# Patient Record
Sex: Female | Born: 1990 | Race: White | Hispanic: No | Marital: Single | Smoking: Never smoker
Health system: Southern US, Community
[De-identification: ages and names within clinical notes are randomized; demographics above are authoritative.]

## PROBLEM LIST (undated history)

## (undated) DIAGNOSIS — F419 Anxiety disorder, unspecified: Secondary | ICD-10-CM

## (undated) DIAGNOSIS — Z9189 Other specified personal risk factors, not elsewhere classified: Secondary | ICD-10-CM

## (undated) DIAGNOSIS — Z803 Family history of malignant neoplasm of breast: Secondary | ICD-10-CM

## (undated) DIAGNOSIS — T7840XA Allergy, unspecified, initial encounter: Secondary | ICD-10-CM

## (undated) DIAGNOSIS — F32A Depression, unspecified: Secondary | ICD-10-CM

## (undated) DIAGNOSIS — Z8041 Family history of malignant neoplasm of ovary: Secondary | ICD-10-CM

## (undated) DIAGNOSIS — G709 Myoneural disorder, unspecified: Secondary | ICD-10-CM

## (undated) DIAGNOSIS — D649 Anemia, unspecified: Secondary | ICD-10-CM

## (undated) DIAGNOSIS — N6009 Solitary cyst of unspecified breast: Secondary | ICD-10-CM

## (undated) HISTORY — DX: Other specified personal risk factors, not elsewhere classified: Z91.89

## (undated) HISTORY — DX: Anemia, unspecified: D64.9

## (undated) HISTORY — DX: Family history of malignant neoplasm of ovary: Z80.41

## (undated) HISTORY — DX: Myoneural disorder, unspecified: G70.9

## (undated) HISTORY — DX: Solitary cyst of unspecified breast: N60.09

## (undated) HISTORY — PX: MANDIBLE SURGERY: SHX707

## (undated) HISTORY — DX: Family history of malignant neoplasm of breast: Z80.3

## (undated) HISTORY — DX: Allergy, unspecified, initial encounter: T78.40XA

---

## 2015-08-19 LAB — HM PAP SMEAR

## 2015-08-24 ENCOUNTER — Other Ambulatory Visit: Payer: Self-pay | Admitting: Medical

## 2015-08-24 ENCOUNTER — Ambulatory Visit
Admission: RE | Admit: 2015-08-24 | Discharge: 2015-08-24 | Disposition: A | Discharge status: Home / Self Care | Payer: BLUE CROSS/BLUE SHIELD | Source: Ambulatory Visit | Attending: Medical | Admitting: Medical

## 2015-08-24 ENCOUNTER — Encounter
Admission: RE | Admit: 2015-08-24 | Discharge: 2015-08-24 | Disposition: A | Discharge status: Home / Self Care | Payer: BLUE CROSS/BLUE SHIELD | Source: Ambulatory Visit | Attending: Medical | Admitting: Medical

## 2015-08-24 DIAGNOSIS — R52 Pain, unspecified: Secondary | ICD-10-CM

## 2015-08-24 DIAGNOSIS — R071 Chest pain on breathing: Secondary | ICD-10-CM | POA: Insufficient documentation

## 2016-05-15 DIAGNOSIS — R1031 Right lower quadrant pain: Secondary | ICD-10-CM | POA: Diagnosis not present

## 2016-11-09 ENCOUNTER — Ambulatory Visit (INDEPENDENT_AMBULATORY_CARE_PROVIDER_SITE_OTHER): Payer: Self-pay | Admitting: Obstetrics and Gynecology

## 2016-11-09 ENCOUNTER — Encounter: Payer: Self-pay | Admitting: Obstetrics and Gynecology

## 2016-11-09 VITALS — BP 120/80 | Ht 68.0 in | Wt 202.0 lb

## 2016-11-09 DIAGNOSIS — R1031 Right lower quadrant pain: Secondary | ICD-10-CM | POA: Diagnosis not present

## 2016-11-09 DIAGNOSIS — Z30011 Encounter for initial prescription of contraceptive pills: Secondary | ICD-10-CM

## 2016-11-09 MED ORDER — NORETHINDRONE-ETH ESTRADIOL 1-35 MG-MCG PO TABS: 1.0000 | ORAL_TABLET | Freq: Every day | ORAL | 0 refills | Status: DC

## 2016-11-09 NOTE — Progress Notes (Signed)
Chief Complaint  Patient presents with  . lower abd pain    HPI:      [name] is a 26 y.o. No obstetric history on file. who LMP was Patient's last menstrual period was 10/16/2016., presents today for episode of severe, sharp RLQ pain again 2 days ago. Sx tolerable with aleve, but still 7/10 on pain scale initially. Sx lasted through the night. Sx improving now though but still tender on RLQ. Pt denies any vag d/c, irritation, odor, fevers. Hasn't been sex active recently. Neg gon/chlam 2/17, but pt wasn't  sex active when STD testing done, and hasn't been since then.  She has a Hx of RLQ pain 11/17. U/S showed FF in CDS, questioned ruptured ovar cyst given sx and hx. She had ovar cyst about 3 1/2 yrs ago too. Pt on OCPs in the past but stopped them since not sex active.    Given recurrence of sx, pt interested in restarting OCPs. Did well on them in the past.  She has annual sched for 11/30/16.   There are no active problems to display for this patient.   Family History  Problem Relation Age of Onset  . Breast cancer Maternal Aunt 53  . Breast cancer Maternal Grandmother 40  . Breast cancer Maternal Grandfather 80  . Ovarian cancer Paternal Grandmother 87  . Breast cancer Cousin     maternal 2nd cousin    Social History   Social History  . Marital status: Single    Spouse name: N/A  . Number of children: N/A  . Years of education: N/A   Occupational History  . Not on file.   Social History Main Topics  . Smoking status: Not on file  . Smokeless tobacco: Not on file  . Alcohol use Not on file  . Drug use: Unknown  . Sexual activity: Not on file   Other Topics Concern  . Not on file   Social History Narrative  . No narrative on file     Current Outpatient Prescriptions:  .  cholecalciferol (VITAMIN D) 1000 units tablet, Take 1,000 Units by mouth daily., Disp: , Rfl:  .  norethindrone-ethinyl estradiol 1/35 (ORTHO-NOVUM, NORTREL,CYCLAFEM) tablet,  Take 1 tablet by mouth daily., Disp: 1 Package, Rfl: 0  Review of Systems  Constitutional: Negative for fever.  Gastrointestinal: Negative for blood in stool, constipation, diarrhea, nausea and vomiting.  Genitourinary: Positive for pelvic pain. Negative for dyspareunia, dysuria, flank pain, frequency, hematuria, urgency, vaginal bleeding, vaginal discharge and vaginal pain.  Musculoskeletal: Negative for back pain.  Skin: Negative for rash.     OBJECTIVE:   Vitals:  BP 120/80   Ht 5\' 8"  (1.727 m)   Wt 202 lb (91.6 kg)   LMP 10/16/2016   BMI 30.71 kg/m   Physical Exam  Constitutional: She is oriented to person, place, and time and well-developed, well-nourished, and in no distress. Vital signs are normal.  Genitourinary: Uterus normal, cervix normal, left adnexa normal and vulva normal. Uterus is not enlarged. Cervix exhibits no motion tenderness and no tenderness. Right adnexum displays tenderness. Right adnexum displays no mass. Left adnexum displays no mass and no tenderness. Vulva exhibits no erythema, no exudate, no lesion, no rash and no tenderness. Vagina exhibits no lesion.  Neurological: She is oriented to person, place, and time.  Vitals reviewed.   Assessment/Plan: RLQ abdominal pain - Sx improving. Hx of prob ruptured ovar cysts. Discussed OCPs/depo to prevent. Pt wants to restart OCPs. F/u  at annual in a few wks. Will check u/s if sx persist  Encounter for initial prescription of contraceptive pills - OCP Rx eRxd.  - Plan: norethindrone-ethinyl estradiol 1/35 (ORTHO-NOVUM, NORTREL,CYCLAFEM) tablet    Return if symptoms worsen or fail to improve.  Arnold Depinto B. Jakiya Bookbinder, PA-C 11/09/2016 11:51 AM

## 2016-11-22 ENCOUNTER — Telehealth: Payer: Self-pay | Admitting: Obstetrics and Gynecology

## 2016-11-22 NOTE — Telephone Encounter (Signed)
Pt is calling about her bill stating she is receiving an uninsured discount. Pt reports having insurance. Would you look into this and Notify pt. Thank you!

## 2016-11-30 ENCOUNTER — Encounter: Payer: Self-pay | Admitting: Advanced Practice Midwife

## 2016-11-30 ENCOUNTER — Ambulatory Visit (INDEPENDENT_AMBULATORY_CARE_PROVIDER_SITE_OTHER): Payer: BLUE CROSS/BLUE SHIELD | Admitting: Advanced Practice Midwife

## 2016-11-30 VITALS — BP 118/70 | HR 88 | Ht 68.0 in | Wt 206.0 lb

## 2016-11-30 DIAGNOSIS — Z01419 Encounter for gynecological examination (general) (routine) without abnormal findings: Secondary | ICD-10-CM | POA: Diagnosis not present

## 2016-11-30 DIAGNOSIS — Z30011 Encounter for initial prescription of contraceptive pills: Secondary | ICD-10-CM | POA: Diagnosis not present

## 2016-11-30 DIAGNOSIS — Z124 Encounter for screening for malignant neoplasm of cervix: Secondary | ICD-10-CM | POA: Diagnosis not present

## 2016-11-30 MED ORDER — NORETHINDRONE-ETH ESTRADIOL 1-35 MG-MCG PO TABS: 1.0000 | ORAL_TABLET | Freq: Every day | ORAL | 10 refills | Status: DC

## 2016-11-30 NOTE — Progress Notes (Signed)
Patient ID: Kelly Montoya, female   DOB: 1990-10-19, 26 y.o.   MRN: [id number]     Gynecology Annual Exam  PCP: Talmage Nap, PA-C  Chief Complaint:  Chief Complaint  Patient presents with  . Gynecologic Exam    History of Present Illness: Patient is a 26 y.o. G0P0000 presents for annual exam. The patient has no complaints today. She admits to increasing healthy lifestyle diet and increasing exercise, but has not yet noticed that she has lost any weight. She denies s/s of hypothyroid.  LMP: Patient's last menstrual period was 11/25/2016 (exact date). Average Interval: regular, 28 days Duration of flow: 7 days Heavy Menses: yes Clots: no Intermenstrual Bleeding: no Postcoital Bleeding: no Dysmenorrhea: no  The patient is not currently sexually active. She currently uses OCPs mainly for regulation of menses and history of ovarian cysts. She denies dyspareunia.  The patient does perform self breast exams.  There is notable family history of breast or ovarian cancer in her family. She has previously had my risk testing and plans to repeat at 5 years per plan with Ardeth Perfect, PA.  The patient wears seatbelts: yes.  The patient has regular exercise: yes.    The patient denies current symptoms of depression.    Review of Systems: Review of Systems  Constitutional: Negative.   HENT: Negative.   Eyes: Negative.   Respiratory: Negative.   Cardiovascular: Negative.   Gastrointestinal: Negative.   Genitourinary: Negative.   Musculoskeletal: Negative.   Skin: Negative.   Neurological: Negative.   Endo/Heme/Allergies: Negative.   Psychiatric/Behavioral: Negative.     Past Medical History:  Past Medical History:  Diagnosis Date  . Anemia   . Breast cyst   . Family history of breast cancer    My Risk neg 2/17, NBN VUS  . Family history of ovarian cancer   . Increased risk of breast cancer    IBIS Lifetime risk 25.4%; MyRisk neg    Past Surgical History:    History reviewed. No pertinent surgical history.  Gynecologic History:  Patient's last menstrual period was 11/25/2016 (exact date). Contraception: OCPs Last Pap: Results were: no abnormalities   Obstetric History: G0P0000  Family History:  Family History  Problem Relation Age of Onset  . Breast cancer Maternal Aunt 75  . Breast cancer Maternal Grandmother 33  . Breast cancer Maternal Grandfather 80  . Ovarian cancer Paternal Grandmother 27  . Breast cancer Cousin        maternal 2nd cousin    Social History:  Social History   Social History  . Marital status: Single    Spouse name: N/A  . Number of children: N/A  . Years of education: N/A   Occupational History  . Not on file.   Social History Main Topics  . Smoking status: Never Smoker  . Smokeless tobacco: Never Used  . Alcohol use Yes  . Drug use: No  . Sexual activity: Not Currently    Birth control/ protection: Pill   Other Topics Concern  . Not on file   Social History Narrative  . No narrative on file    Allergies:  No Known Allergies  Medications: Prior to Admission medications   Medication Sig Start Date End Date Taking? Authorizing Provider  cholecalciferol (VITAMIN D) 1000 units tablet Take 1,000 Units by mouth daily.    [provider]  norethindrone-ethinyl estradiol 1/35 (ORTHO-NOVUM, NORTREL,CYCLAFEM) tablet Take 1 tablet by mouth daily. 11/30/16   Rod Can, CNM  Physical Exam Vitals: Blood pressure 118/70, pulse 88, height 5\' 8"  (1.727 m), weight 206 lb (93.4 kg), last menstrual period 11/25/2016.  General: NAD HEENT: normocephalic, anicteric Thyroid: no enlargement, no palpable nodules Pulmonary: No increased work of breathing, CTAB Cardiovascular: RRR, distal pulses 2+ Breast: Breast symmetrical, no tenderness, no palpable nodules or masses, no skin or nipple retraction present, no nipple discharge.  No axillary or supraclavicular lymphadenopathy. Abdomen: NABS,  soft, non-tender, non-distended.  Umbilicus without lesions.  No hepatomegaly, splenomegaly or masses palpable. No evidence of hernia  Genitourinary:  External: Normal external female genitalia.  Normal urethral meatus, normal  Bartholin's and Skene's glands.    Vagina: Normal vaginal mucosa, no evidence of prolapse.    Cervix: Grossly normal in appearance, no bleeding, no CMT  Uterus: Non-enlarged, mobile, normal contour.    Adnexa: ovaries non-enlarged, no adnexal masses  Rectal: deferred  Lymphatic: no evidence of inguinal lymphadenopathy Extremities: no edema, erythema, or tenderness Neurologic: Grossly intact Psychiatric: mood appropriate, affect full     Assessment: 26 y.o. G0P0000 Well Woman exam with PAP  Plan: Problem List Items Addressed This Visit    None    Visit Diagnoses    Well woman exam with routine gynecological exam    -  Primary   Encounter for initial prescription of contraceptive pills       OCP Rx eRxd.    Relevant Medications   norethindrone-ethinyl estradiol 1/35 (ORTHO-NOVUM, NORTREL,CYCLAFEM) tablet   Cervical cancer screening       Relevant Orders   Pap IG w/ reflex to HPV when ASC-U      1) 4) Gardasil Series discussed and if applicable offered to patient - Patient has previously completed 1 of the 3 shot series about 6 years ago. She did not follow through with the remaining vaccines. She will be 26 at the end of this month and will not be eligible for insurance coverage to complete the series. She opts out of repeating the vaccine.  2) STI screening was offered and declined   3) ASCCP guidelines and rational discussed.  Patient opts for yearly screening interval  4) Contraception - Education given regarding options for contraception, including LARCs and Depo Provera. Pt chooses to continue on pills at this time.  5) Continue healthy lifestyle diet and exercise   6) Follow up 1 year for routine annual exam   Rod Can, CNM

## 2016-12-03 LAB — PAP IG W/ RFLX HPV ASCU: PAP SMEAR COMMENT: 0

## 2016-12-07 ENCOUNTER — Encounter: Payer: Self-pay | Admitting: Medical

## 2016-12-07 ENCOUNTER — Ambulatory Visit: Payer: BLUE CROSS/BLUE SHIELD | Admitting: Medical

## 2016-12-07 VITALS — BP 104/74 | HR 88 | Temp 99.4°F | Resp 16

## 2016-12-07 DIAGNOSIS — R5383 Other fatigue: Secondary | ICD-10-CM

## 2016-12-07 DIAGNOSIS — Z0000 Encounter for general adult medical examination without abnormal findings: Secondary | ICD-10-CM

## 2016-12-07 NOTE — Progress Notes (Signed)
Subjective:    Patient ID: Kelly Montoya, female    DOB: Dec 12, 1990, 26 y.o.   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 She has no complaints today.   Review of Systems  Constitutional: Positive for fatigue.  HENT: Negative.   Eyes: Negative.   Respiratory: Negative.   Cardiovascular: Negative.   Gastrointestinal: Negative.   Endocrine: Positive for heat intolerance.  Genitourinary: Negative.   Musculoskeletal: Negative.   Skin: Negative.   Allergic/Immunologic: Positive for environmental allergies.  Neurological: Positive for headaches.  Hematological: Bruises/bleeds easily.  Psychiatric/Behavioral: Negative.    Headaches with her mensis cycle. Takes Aleve which helps. History of ovarian cyst in April and November of  2017. This is why she was placed on OCP,  Which she started 2 weeks ago.She has had some spotting on the OCP.    Objective:   Physical Exam  Constitutional: She is oriented to person, place, and time. She appears well-developed and well-nourished.  HENT:  Head: Normocephalic and atraumatic.  Right Ear: Hearing, tympanic membrane, external ear and ear canal normal.  Left Ear: Hearing, tympanic membrane, external ear and ear canal normal.  Nose: Nose normal.  Mouth/Throat: Uvula is midline, oropharynx is clear and moist and mucous membranes are normal.  Eyes: Conjunctivae, EOM and lids are normal. Pupils are equal, round, and reactive to light.  Neck: Trachea normal, normal range of motion and full passive range of motion without pain. Neck supple. No thyromegaly present.  Cardiovascular: Normal rate, regular rhythm, normal heart sounds, intact distal pulses and normal pulses.  Exam reveals no gallop and no friction rub.   No murmur heard. Pulmonary/Chest: Breath sounds normal.  Abdominal: Soft. Normal  appearance and bowel sounds are normal.  Musculoskeletal: Normal range of motion.  Lymphadenopathy:    She has no cervical adenopathy.       Right: No supraclavicular adenopathy present.       Left: No supraclavicular adenopathy present.  Neurological: She is alert and oriented to person, place, and time. She has normal strength and normal reflexes. No cranial nerve deficit or sensory deficit. She displays a negative Romberg sign. GCS eye subscore is 4. GCS verbal subscore is 5. GCS motor subscore is 6.  Reflex Scores:      Brachioradialis reflexes are 2+ on the right side and 2+ on the left side.      Patellar reflexes are 2+ on the right side and 2+ on the left side.      Achilles reflexes are 2+ on the right side and 2+ on the left side. Skin: Skin is warm, dry and intact. Ecchymosis noted. No cyanosis. Nails show no clubbing.     Psychiatric: She has a normal mood and affect. Her speech is normal and behavior is normal. Judgment and thought content normal. Cognition and memory are normal.  Nursing note and vitals reviewed.  Upper right thigh old brown bruise. Patient unsure how she got bruised.Measures about 4 cm x 3 cm. Non-tender to palpation.       Assessment & Plan:  Primary care visit. Ordered CBC w/diff, Met C, TSH and thyroid panel, Vitamin D level and B12 and folate. Will call patient with results. RN Bubba Camp called patient with lab results. All labs within normal limits except low Vitamin D. Low Vitamin D3 4000IU/day recheck in 3 months. ( called  patient on 12/19/16)

## 2016-12-08 LAB — COMPREHENSIVE METABOLIC PANEL
A/G RATIO: 1.8 (ref 1.2–2.2)
ALT: 21 IU/L (ref 0–32)
AST: 23 IU/L (ref 0–40)
Albumin: 4.5 g/dL (ref 3.5–5.5)
Alkaline Phosphatase: 53 IU/L (ref 39–117)
BILIRUBIN TOTAL: 0.4 mg/dL (ref 0.0–1.2)
BUN/Creatinine Ratio: 12 (ref 9–23)
BUN: 10 mg/dL (ref 6–20)
CHLORIDE: 104 mmol/L (ref 96–106)
CO2: 23 mmol/L (ref 18–29)
Calcium: 9.8 mg/dL (ref 8.7–10.2)
Creatinine, Ser: 0.82 mg/dL (ref 0.57–1.00)
GFR calc non Af Amer: 98 mL/min/{1.73_m2} (ref >59)
GFR, EST AFRICAN AMERICAN: 113 mL/min/{1.73_m2} (ref >59)
Globulin, Total: 2.5 g/dL (ref 1.5–4.5)
Glucose: 85 mg/dL (ref 65–99)
POTASSIUM: 4.4 mmol/L (ref 3.5–5.2)
Sodium: 142 mmol/L (ref 134–144)
TOTAL PROTEIN: 7 g/dL (ref 6.0–8.5)

## 2016-12-08 LAB — CBC WITH DIFFERENTIAL/PLATELET
BASOS ABS: 0 10*3/uL (ref 0.0–0.2)
Basos: 0 %
EOS (ABSOLUTE): 0.1 10*3/uL (ref 0.0–0.4)
Eos: 2 %
Hematocrit: 39.1 % (ref 34.0–46.6)
Hemoglobin: 12.9 g/dL (ref 11.1–15.9)
IMMATURE GRANS (ABS): 0 10*3/uL (ref 0.0–0.1)
Immature Granulocytes: 0 %
LYMPHS: 38 %
Lymphocytes Absolute: 2.1 10*3/uL (ref 0.7–3.1)
MCH: 28 pg (ref 26.6–33.0)
MCHC: 33 g/dL (ref 31.5–35.7)
MCV: 85 fL (ref 79–97)
MONOS ABS: 0.4 10*3/uL (ref 0.1–0.9)
Monocytes: 7 %
NEUTROS ABS: 2.9 10*3/uL (ref 1.4–7.0)
Neutrophils: 53 %
PLATELETS: 254 10*3/uL (ref 150–379)
RBC: 4.61 x10E6/uL (ref 3.77–5.28)
RDW: 14.4 % (ref 12.3–15.4)
WBC: 5.5 10*3/uL (ref 3.4–10.8)

## 2016-12-08 LAB — THYROID PANEL WITH TSH
FREE THYROXINE INDEX: 1.4 (ref 1.2–4.9)
T3 Uptake Ratio: 18 % — ABNORMAL LOW (ref 24–39)
T4, Total: 8 ug/dL (ref 4.5–12.0)
TSH: 1.82 u[IU]/mL (ref 0.450–4.500)

## 2016-12-08 LAB — VITAMIN D 25 HYDROXY (VIT D DEFICIENCY, FRACTURES): VIT D 25 HYDROXY: 36.8 ng/mL (ref 30.0–100.0)

## 2016-12-14 LAB — SPECIMEN STATUS REPORT

## 2016-12-14 LAB — B12 AND FOLATE PANEL
Folate: 16.5 ng/mL (ref >3.0)
VITAMIN B 12: 287 pg/mL (ref 232–1245)

## 2017-04-12 DIAGNOSIS — Z23 Encounter for immunization: Secondary | ICD-10-CM | POA: Diagnosis not present

## 2017-11-01 ENCOUNTER — Other Ambulatory Visit: Payer: Self-pay | Admitting: Advanced Practice Midwife

## 2017-11-01 DIAGNOSIS — Z30011 Encounter for initial prescription of contraceptive pills: Secondary | ICD-10-CM

## 2017-11-29 ENCOUNTER — Other Ambulatory Visit: Payer: Self-pay | Admitting: Advanced Practice Midwife

## 2017-11-29 DIAGNOSIS — Z30011 Encounter for initial prescription of contraceptive pills: Secondary | ICD-10-CM

## 2018-02-20 DIAGNOSIS — Z113 Encounter for screening for infections with a predominantly sexual mode of transmission: Secondary | ICD-10-CM | POA: Diagnosis not present

## 2018-02-20 DIAGNOSIS — Z3040 Encounter for surveillance of contraceptives, unspecified: Secondary | ICD-10-CM | POA: Diagnosis not present

## 2018-02-20 DIAGNOSIS — Z23 Encounter for immunization: Secondary | ICD-10-CM | POA: Diagnosis not present

## 2018-02-20 DIAGNOSIS — Z6831 Body mass index (BMI) 31.0-31.9, adult: Secondary | ICD-10-CM | POA: Diagnosis not present

## 2018-02-20 DIAGNOSIS — Z124 Encounter for screening for malignant neoplasm of cervix: Secondary | ICD-10-CM | POA: Diagnosis not present

## 2018-02-20 DIAGNOSIS — Z01411 Encounter for gynecological examination (general) (routine) with abnormal findings: Secondary | ICD-10-CM | POA: Diagnosis not present

## 2018-04-22 DIAGNOSIS — Z23 Encounter for immunization: Secondary | ICD-10-CM | POA: Diagnosis not present

## 2018-05-01 DIAGNOSIS — J029 Acute pharyngitis, unspecified: Secondary | ICD-10-CM | POA: Diagnosis not present

## 2018-08-06 DIAGNOSIS — Z23 Encounter for immunization: Secondary | ICD-10-CM | POA: Diagnosis not present

## 2018-08-06 DIAGNOSIS — Z3040 Encounter for surveillance of contraceptives, unspecified: Secondary | ICD-10-CM | POA: Diagnosis not present

## 2018-10-22 DIAGNOSIS — H5213 Myopia, bilateral: Secondary | ICD-10-CM | POA: Diagnosis not present

## 2019-07-10 ENCOUNTER — Ambulatory Visit: Payer: BC Managed Care – PPO | Attending: Internal Medicine

## 2019-07-10 DIAGNOSIS — Z20822 Contact with and (suspected) exposure to covid-19: Secondary | ICD-10-CM

## 2019-07-12 LAB — NOVEL CORONAVIRUS, NAA: SARS-CoV-2, NAA: NOT DETECTED

## 2019-10-04 ENCOUNTER — Ambulatory Visit: Payer: BC Managed Care – PPO

## 2019-10-13 DIAGNOSIS — M5126 Other intervertebral disc displacement, lumbar region: Secondary | ICD-10-CM | POA: Insufficient documentation

## 2019-11-07 ENCOUNTER — Other Ambulatory Visit: Payer: Self-pay

## 2019-11-07 ENCOUNTER — Ambulatory Visit: Payer: BC Managed Care – PPO | Attending: Family Medicine | Admitting: Rehabilitative and Restorative Service Providers"

## 2019-11-07 ENCOUNTER — Encounter: Payer: Self-pay | Admitting: Rehabilitative and Restorative Service Providers"

## 2019-11-07 DIAGNOSIS — M545 Low back pain, unspecified: Secondary | ICD-10-CM

## 2019-11-07 DIAGNOSIS — M25651 Stiffness of right hip, not elsewhere classified: Secondary | ICD-10-CM

## 2019-11-07 DIAGNOSIS — M25652 Stiffness of left hip, not elsewhere classified: Secondary | ICD-10-CM

## 2019-11-07 DIAGNOSIS — R2689 Other abnormalities of gait and mobility: Secondary | ICD-10-CM | POA: Diagnosis present

## 2019-11-07 DIAGNOSIS — M5416 Radiculopathy, lumbar region: Secondary | ICD-10-CM | POA: Insufficient documentation

## 2019-11-07 DIAGNOSIS — M25552 Pain in left hip: Secondary | ICD-10-CM | POA: Diagnosis present

## 2019-11-07 NOTE — Therapy (Signed)
[facility] [address] [place], [place], [address] Phone: [phone]   Fax:  [phone]  Physical Therapy Evaluation  Patient Details  Name: Kelly Montoya MRN: [id number] Date of Birth: 1990-10-01 Referring Provider (PT): Rachell Cipro MD   Encounter Date: 11/07/2019  PT End of Session - 11/07/19 1500    Visit Number  1    Number of Visits  17    Date for PT Re-Evaluation  01/06/20    Authorization Type  BCBS    PT Start Time  0845    PT Stop Time  0927    PT Time Calculation (min)  42 min    Activity Tolerance  Patient tolerated treatment well    Behavior During Therapy  [facility] for tasks assessed/performed       Past Medical History:  Diagnosis Date  . Allergy   . Anemia   . Breast cyst   . Family history of breast cancer    My Risk neg 2/17, NBN VUS  . Family history of ovarian cancer   . Increased risk of breast cancer    IBIS Lifetime risk 25.4%; MyRisk neg  . Neuromuscular disorder [facility])     Past Surgical History:  Procedure Laterality Date  . MANDIBLE SURGERY Bilateral 01/012011   nerve damage with numbness on the chin on the right side.    There were no vitals filed for this visit.   Subjective Assessment - 11/07/19 0848    Subjective  Patient reports that 4 weeks ago she had pain shooting down her LLE. She had pain so bad that she was feeling light headed. Went to urgent care and they sent her to the ED. She was hospitalized for 4 days. She was on steroids and gabapentin. She is now on a muscle relaxer and OTC anti-inflammatory. While she is seated, she is okay. I can't stand for a long period of time - showering is challenging to me. I still have some pain in my L ankle (I rarely feel the pain in my L foot/toes).    Pertinent History  L4/5 and L5/S1 herniated disc    Limitations  House hold activities;Lifting;Standing;Walking    How long can you sit comfortably?  I try to sit reclined to  make myself comfortable - when sitting in this position, I can be here for a long while    How long can you stand comfortably?  5 minutes maximum    How long can you walk comfortably?  I haven't tried it    Patient Stated Goals  reduce my pain and allow return to normal life    Currently in Pain?  Yes    Pain Score  3     Pain Location  Back    Pain Orientation  Left;Mid;Lower    Pain Descriptors / Indicators  Dull    Pain Type  Acute pain   ~ 1 month   Pain Radiating Towards  L ankle    Pain Onset  1 to 4 weeks ago    Pain Frequency  Constant    Aggravating Factors   weight bearing, walking, standing         [facility] PT Assessment - 11/07/19 0853      Assessment   Medical Diagnosis  Lumbar Spine Pain     Referring Provider (PT)  Rachell Cipro MD    Onset Date/Surgical Date  10/11/19   hospitalized due to pain in lumbar spine and LLE  Prior Therapy  none      Precautions   Precautions  None      Balance Screen   Has the patient fallen in the past 6 months  No    Has the patient had a decrease in activity level because of a fear of falling?   No    Is the patient reluctant to leave their home because of a fear of falling?   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; hoping to return to work in person - sitting on couch to perform computer work as sitting at [employer] table/chair was very painful    Leisure  hiking, walking dog, swimming  (Garment/textile technologist)       Cognition   Overall Cognitive Status  Within Functional Limits for tasks assessed      Sensation   Light Touch  Appears Intact   per assessment of BLEs   Hot/Cold  Appears Intact   can differentiate hot/cold water in shower     ROM / Strength   AROM / PROM / Strength  AROM;Strength      AROM   Overall AROM   Deficits    Overall AROM Comments  L knee extension is full range, but painful when attempting to perform LAQ in sitting - able to achieve full range in supine     AROM Assessment Site  Hip;Lumbar    Right/Left Hip  Left    Left Hip External Rotation   32   stiff and painful at end range   Lumbar Flexion  41   painful at end range - aggravating to LLE rad sx   Lumbar Extension  trace   more thoracic/lumbar junction extension; min true l/s ext     Strength   Overall Strength  Deficits    Strength Assessment Site  Hip;Knee;Ankle    Right/Left Hip  Right;Left    Right Hip Flexion  5/5    Right Hip Extension  4+/5    Right Hip External Rotation   5/5    Right Hip Internal Rotation  5/5    Right Hip ABduction  5/5    Right Hip ADduction  5/5    Left Hip Flexion  4-/5    Left Hip Extension  4-/5    Left Hip External Rotation  3-/5   due to ROM restriction   Left Hip Internal Rotation  4/5    Left Hip ABduction  4/5    Left Hip ADduction  4+/5    Right/Left Knee  Right;Left    Right Knee Flexion  5/5    Right Knee Extension  5/5    Left Knee Flexion  4/5    Left Knee Extension  3/5    Right/Left Ankle  Right;Left  Right Ankle Dorsiflexion  5/5    Left Ankle Dorsiflexion  5/5      Ambulation/Gait   Ambulation/Gait  Yes    Ambulation/Gait Assistance  6: Modified independent (Device/Increase time)    Ambulation Distance (Feet)  150 Feet    Assistive device  None    Gait Pattern  Decreased step length - right;Decreased stance time - left;Decreased weight shift to left    Ambulation Surface  Level;Indoor    Gait velocity  2.72 ft/sec                 Objective measurements completed on examination: See above findings.              PT Education - 11/07/19 1500    Education Details  extensive education on disc herniation and rationale behidn assessing for a lumbar spine directional preference, POC, role of PT, findings from today's assessment    Person(s) Educated  Patient    Methods  Explanation    Comprehension  Verbalized understanding;Need further instruction       PT Short Term Goals - 11/07/19 1517      PT SHORT TERM GOAL #1   Title  Patient will be independent with HEP to foster improved strength and reduced pain. (ALL STGs DUE 12/07/19)    Time  4    Period  Weeks    Status  New    Target Date  12/07/19      PT SHORT TERM GOAL #2   Title  Patient will have >/= 10 degrees of lumbar extension in order to demonstrate a decreased reliance on compensatory movement strategies during ADLs.    Time  4    Period  Weeks    Status  New      PT SHORT TERM GOAL #3   Title  Patient will demonstrate >/= 40 degrees of bilateral hip rotation to decrease reliance on compensatory movements during ADLs to reduce injury risk.    Time  4    Period  Weeks    Status  New      PT SHORT TERM GOAL #4   Title  Patient will report she is able to tolerate standing activities (such as meal prep) for >/= 15 minutes with use of exercise/positioning as needed to indicate improvement in ability to perform ADLs.    Time  4    Period  Weeks    Status  New        PT Long Term Goals - 11/07/19 1508      PT LONG TERM GOAL #1   Title  Patient will be independent with HEP to foster improved strength and reduced pain. (ALL LTGs DUE 01/06/20)    Time  8    Period  Weeks    Status  New    Target Date  01/06/20      PT LONG TERM GOAL #2   Title  Patient will demonstrate >/= 45 degrees of bilateral hip rotation to decrease reliance on compensatory movements during ADLs to reduce injury risk.    Time  8    Period  Weeks     Status  New      PT LONG TERM GOAL #3   Title  Patient's bilateral hip flexors, extensors, and abductors will be 5/5 to demonstrate improvement in functional mobility.    Time  8    Period  Weeks    Status  New      PT LONG TERM  GOAL #4   Title  Patient will have >/= 20 degrees of lumbar extension in order to demonstrate a decreased reliance on compensatory movement strategies during ADLs.    Time  8    Period  Weeks    Status  New      PT LONG TERM GOAL #5   Title  Patient will report she is able to fully return to all work related duties with ability to verbalize changes in mechanics/ergonomics as needed to modify her work day/environment to decrease risk of further injury.    Time  8    Period  Weeks    Status  New             Plan - 11/07/19 1501    Clinical Impression Statement  Patient is a 29 year old female presenting to [facility] neuro for evaluation of her lumbar spine and left lower extremity radicular pain, which has been present for approximately 1 month with no specific mechanism of injury; however, was so severe she was hospitalized for 4 days in early April 2021. Her past medical history is significant for L4/5 and L5/S1 disc herniation. She presents today with marked asymmetry in LE range of motion and strength with increased impairment noted on left side, poor lumbar spine range of motion, and poor postural awareness and use of mechanics. PT able to begin assessment for a lumbar spine directional preference, but limited due to time constraints. She will benefit from skilled PT in order to maximize functional mobility and reduce pain.    Personal Factors and Comorbidities  Comorbidity 1    Comorbidities  L4/L5 and L5/S1 disc herniation    Examination-Activity Limitations  Sit;Sleep;Lift;Squat;Locomotion Level;Stairs;Carry;Stand    Examination-Participation Restrictions  Interpersonal Relationship;Yard Work;Cleaning;Laundry;Community Activity;Shop;Driving;Meal Prep     Stability/Clinical Decision Making  Stable/Uncomplicated    Clinical Decision Making  Low    Rehab Potential  Good    PT Frequency  2x / week   with plans to taper to 1x/wk if indicated in future once pain is managed   PT Duration  8 weeks    PT Treatment/Interventions  ADLs/Self Care Home Management;Aquatic Therapy;Biofeedback;Traction;Moist Heat;Electrical Stimulation;Cryotherapy;Ultrasound;DME Instruction;Gait training;Therapeutic exercise;Therapeutic activities;Functional mobility training;Stair training;Balance training;Neuromuscular re-education;Cognitive remediation;Patient/family education;Manual techniques;Passive range of motion;Energy conservation;Spinal Manipulations;Joint Manipulations    PT Next Visit Plan  initial HEP for lumbar stabilization and hip external rotation stretching, assessment for a lumbar spine directional preference to reduce radicular symptoms    Consulted and Agree with Plan of Care  Patient       Patient will benefit from skilled therapeutic intervention in order to improve the following deficits and impairments:  Abnormal gait, Decreased balance, Decreased endurance, Decreased mobility, Difficulty walking, Decreased range of motion, Impaired perceived functional ability, Improper body mechanics, Decreased activity tolerance, Decreased strength, Impaired flexibility, Postural dysfunction, Pain  Visit Diagnosis: Radiculopathy, lumbar region  Pain in left hip  Stiffness of left hip, not elsewhere classified  Other abnormalities of gait and mobility  Stiffness of right hip, not elsewhere classified  Acute midline low back pain, unspecified whether sciatica present     Problem List There are no problems to display for this patient.   Juliann Pulse, PT, DPT  11/07/2019, 3:23 PM  [facility] [address] [place], [place], [address] Phone: [phone]   Fax:  [phone]  Name:  Kelly Montoya MRN: [id number] Date of Birth: 1990/10/11

## 2019-11-11 ENCOUNTER — Other Ambulatory Visit: Payer: Self-pay

## 2019-11-11 ENCOUNTER — Ambulatory Visit: Payer: BC Managed Care – PPO | Attending: Family Medicine | Admitting: Physical Therapy

## 2019-11-11 ENCOUNTER — Encounter: Payer: Self-pay | Admitting: Physical Therapy

## 2019-11-11 DIAGNOSIS — M545 Low back pain, unspecified: Secondary | ICD-10-CM

## 2019-11-11 DIAGNOSIS — M25652 Stiffness of left hip, not elsewhere classified: Secondary | ICD-10-CM | POA: Insufficient documentation

## 2019-11-11 DIAGNOSIS — R2689 Other abnormalities of gait and mobility: Secondary | ICD-10-CM | POA: Insufficient documentation

## 2019-11-11 DIAGNOSIS — M25651 Stiffness of right hip, not elsewhere classified: Secondary | ICD-10-CM | POA: Insufficient documentation

## 2019-11-11 DIAGNOSIS — M5416 Radiculopathy, lumbar region: Secondary | ICD-10-CM | POA: Diagnosis not present

## 2019-11-11 DIAGNOSIS — M25552 Pain in left hip: Secondary | ICD-10-CM | POA: Diagnosis present

## 2019-11-11 NOTE — Patient Instructions (Signed)
Access Code: [email or web address] URL: [email or web address] Date: 11/11/2019 Prepared by: Willow Ora  Exercises Supine Transversus Abdominis Bracing - Hands on Ground - 1 x daily - 5 x weekly - 1 sets - 10 reps - 5 hold Hooklying Sequential Leg March and Lower - 1 x daily - 5 x weekly - 1 sets - 10 reps Supine Bilateral Isometric Hip Flexion - 1 x daily - 5 x weekly - 1 sets - 10 reps - 5 hold Hooklying Single Knee to Chest Stretch - 1 x daily - 5 x weekly - 1 sets - 3 reps - 30 hold Supine Double Knee to Chest - 1 x daily - 5 x weekly - 1 sets - 3 reps - 30 hold Cat-Camel - 1 x daily - 5 x weekly - 1 sets - 10 reps Child's Pose Stretch - 1 x daily - 5 x weekly - 1 sets - 3 reps - 30 hold

## 2019-11-11 NOTE — Therapy (Signed)
[facility] [address] [place], [place], [address] Phone: [phone]   Fax:  [phone]  Physical Therapy Treatment  Patient Details  Name: Kelly Montoya MRN: [id number] Date of Birth: 1990/11/30 Referring Provider (PT): Rachell Cipro MD   Encounter Date: 11/11/2019  PT End of Session - 11/11/19 0720    Visit Number  2    Number of Visits  17    Date for PT Re-Evaluation  01/06/20    Authorization Type  BCBS    PT Start Time  0716    PT Stop Time  0757    PT Time Calculation (min)  41 min    Activity Tolerance  Patient tolerated treatment well    Behavior During Therapy  [facility] for tasks assessed/performed       Past Medical History:  Diagnosis Date  . Allergy   . Anemia   . Breast cyst   . Family history of breast cancer    My Risk neg 2/17, NBN VUS  . Family history of ovarian cancer   . Increased risk of breast cancer    IBIS Lifetime risk 25.4%; MyRisk neg  . Neuromuscular disorder [facility])     Past Surgical History:  Procedure Laterality Date  . MANDIBLE SURGERY Bilateral 01/012011   nerve damage with numbness on the chin on the right side.    There were no vitals filed for this visit.  Subjective Assessment - 11/11/19 0717    Subjective  No new complaints. No falls. Continues with radicular pain into left LE.    Pertinent History  L4/5 and L5/S1 herniated disc    Limitations  House hold activities;Lifting;Standing;Walking    How long can you sit comfortably?  I try to sit reclined to make myself comfortable - when sitting in this position, I can be here for a long while    How long can you stand comfortably?  5 minutes maximum    How long can you walk comfortably?  I haven't tried it    Patient Stated Goals  reduce my pain and allow return to normal life    Currently in Pain?  Yes    Pain Score  4     Pain Location  Back    Pain Orientation  Left;Mid;Lower    Pain Descriptors /  Indicators  Dull;Shooting    Pain Type  Acute pain    Pain Radiating Towards  down left LE to the ankle    Pain Onset  More than a month ago    Pain Frequency  Constant    Aggravating Factors   weight bearing, walking, standing    Pain Relieving Factors  Muscle relaxer, OTC pain meds          [facility] Adult PT Treatment/Exercise - 11/11/19 0721      Exercises   Exercises  Other Exercises    Other Exercises   issued ex's to HEP to address core strengthening and stretching. refer to [place] for full details.       Manual Therapy   Manual Therapy  Manual Traction    Manual Traction  manual sheet traction for 30 sec holds  5 reps. Pt with reports of decreased radicular pain with traction; left LE distraction in hooklying position after core strengthening due to return of radicular symptoms. held for 10 sec's for 5 reps with decreased symptoms reported.      Issued to HEP today:   Access Code: [email or web address] URL: [email or web address]  Date: 11/11/2019 Prepared by: Willow Ora  Exercises Supine Transversus Abdominis Bracing - Hands on Ground - 1 x daily - 5 x weekly - 1 sets - 10 reps - 5 hold Hooklying Sequential Leg March and Lower - 1 x daily - 5 x weekly - 1 sets - 10 reps Supine Bilateral Isometric Hip Flexion - 1 x daily - 5 x weekly - 1 sets - 10 reps - 5 hold Hooklying Single Knee to Chest Stretch - 1 x daily - 5 x weekly - 1 sets - 3 reps - 30 hold Supine Double Knee to Chest - 1 x daily - 5 x weekly - 1 sets - 3 reps - 30 hold Cat-Camel - 1 x daily - 5 x weekly - 1 sets - 10 reps Child's Pose Stretch - 1 x daily - 5 x weekly - 1 sets - 3 reps - 30 hold       PT Education - 11/11/19 0749    Education Details  HEP to address core strengthening and stretching; purpose of mechanical traction with transfer to ortho clinic to trial it.    Person(s) Educated  Patient    Methods  Explanation;Demonstration    Comprehension  Verbalized understanding;Returned  demonstration;Verbal cues required;Need further instruction       PT Short Term Goals - 11/07/19 1517      PT SHORT TERM GOAL #1   Title  Patient will be independent with HEP to foster improved strength and reduced pain. (ALL STGs DUE 12/07/19)    Time  4    Period  Weeks    Status  New    Target Date  12/07/19      PT SHORT TERM GOAL #2   Title  Patient will have >/= 10 degrees of lumbar extension in order to demonstrate a decreased reliance on compensatory movement strategies during ADLs.    Time  4    Period  Weeks    Status  New      PT SHORT TERM GOAL #3   Title  Patient will demonstrate >/= 40 degrees of bilateral hip rotation to decrease reliance on compensatory movements during ADLs to reduce injury risk.    Time  4    Period  Weeks    Status  New      PT SHORT TERM GOAL #4   Title  Patient will report she is able to tolerate standing activities (such as meal prep) for >/= 15 minutes with use of exercise/positioning as needed to indicate improvement in ability to perform ADLs.    Time  4    Period  Weeks    Status  New        PT Long Term Goals - 11/07/19 1508      PT LONG TERM GOAL #1   Title  Patient will be independent with HEP to foster improved strength and reduced pain. (ALL LTGs DUE 01/06/20)    Time  8    Period  Weeks    Status  New    Target Date  01/06/20      PT LONG TERM GOAL #2   Title  Patient will demonstrate >/= 45 degrees of bilateral hip rotation to decrease reliance on compensatory movements during ADLs to reduce injury risk.    Time  8    Period  Weeks    Status  New      PT LONG TERM GOAL #3   Title  Patient's bilateral hip flexors,  extensors, and abductors will be 5/5 to demonstrate improvement in functional mobility.    Time  8    Period  Weeks    Status  New      PT LONG TERM GOAL #4   Title  Patient will have >/= 20 degrees of lumbar extension in order to demonstrate a decreased reliance on compensatory movement strategies during  ADLs.    Time  8    Period  Weeks    Status  New      PT LONG TERM GOAL #5   Title  Patient will report she is able to fully return to all work related duties with ability to verbalize changes in mechanics/ergonomics as needed to modify her work day/environment to decrease risk of further injury.    Time  8    Period  Weeks    Status  New            Plan - 11/11/19 0720    Clinical Impression Statement  Today's skilled session focused on use of manual sheet traction for decreased radicular symptoms with positive results. Will plan to try one session at our ortho clinic to trial use of the mechanical traction machine (pt not able to get an appt until 2 weeks from now). Remainder of session focused on establishment of an HEP for core strengthening and flexibility with flexion focus (pt has flexion peference). The pt is progressing toward goals and should benefit from continued PT to progress toward unmet goals.    Personal Factors and Comorbidities  Comorbidity 1    Comorbidities  L4/L5 and L5/S1 disc herniation    Examination-Activity Limitations  Sit;Sleep;Lift;Squat;Locomotion Level;Stairs;Carry;Stand    Examination-Participation Restrictions  Interpersonal Relationship;Yard Work;Cleaning;Laundry;Community Activity;Shop;Driving;Meal Prep    Stability/Clinical Decision Making  Stable/Uncomplicated    Rehab Potential  Good    PT Frequency  2x / week   with plans to taper to 1x/wk if indicated in future once pain is managed   PT Duration  8 weeks    PT Treatment/Interventions  ADLs/Self Care Home Management;Aquatic Therapy;Biofeedback;Traction;Moist Heat;Electrical Stimulation;Cryotherapy;Ultrasound;DME Instruction;Gait training;Therapeutic exercise;Therapeutic activities;Functional mobility training;Stair training;Balance training;Neuromuscular re-education;Cognitive remediation;Patient/family education;Manual techniques;Passive range of motion;Energy conservation;Spinal  Manipulations;Joint Manipulations    PT Next Visit Plan  continue with manual traction for decreased radicular pain/symptoms (has ortho appt on 12/04/19 to trial mechanical traction, monitor if this will still be needed as it's far out), continue with core/LE strengthening/stretching    PT Home Exercise Plan  Access Code: [email or web address]    Consulted and Agree with Plan of Care  Patient       Patient will benefit from skilled therapeutic intervention in order to improve the following deficits and impairments:  Abnormal gait, Decreased balance, Decreased endurance, Decreased mobility, Difficulty walking, Decreased range of motion, Impaired perceived functional ability, Improper body mechanics, Decreased activity tolerance, Decreased strength, Impaired flexibility, Postural dysfunction, Pain  Visit Diagnosis: Radiculopathy, lumbar region  Pain in left hip  Stiffness of left hip, not elsewhere classified  Other abnormalities of gait and mobility  Stiffness of right hip, not elsewhere classified  Acute midline low back pain, unspecified whether sciatica present     Problem List There are no problems to display for this patient.   Willow Ora, PTA, [place] [address] [place], [place] [address] [phone] 11/11/19, 8:06 AM   Name: Kelly Montoya MRN: [id number] Date of Birth: January 20, 1991

## 2019-11-14 ENCOUNTER — Other Ambulatory Visit: Payer: Self-pay

## 2019-11-14 ENCOUNTER — Ambulatory Visit: Payer: BC Managed Care – PPO | Admitting: Rehabilitative and Restorative Service Providers"

## 2019-11-14 ENCOUNTER — Encounter: Payer: Self-pay | Admitting: Rehabilitative and Restorative Service Providers"

## 2019-11-14 DIAGNOSIS — M25652 Stiffness of left hip, not elsewhere classified: Secondary | ICD-10-CM

## 2019-11-14 DIAGNOSIS — M5416 Radiculopathy, lumbar region: Secondary | ICD-10-CM | POA: Diagnosis not present

## 2019-11-14 DIAGNOSIS — M545 Low back pain, unspecified: Secondary | ICD-10-CM

## 2019-11-14 DIAGNOSIS — M25651 Stiffness of right hip, not elsewhere classified: Secondary | ICD-10-CM

## 2019-11-14 DIAGNOSIS — M25552 Pain in left hip: Secondary | ICD-10-CM

## 2019-11-14 NOTE — Therapy (Signed)
[facility] [address] [place], [place], [address] Phone: [phone]   Fax:  [phone]  Physical Therapy Treatment  Patient Details  Name: Kelly Montoya MRN: [id number] Date of Birth: 11/15/1990 Referring Provider (PT): Rachell Cipro MD   Encounter Date: 11/14/2019  PT End of Session - 11/14/19 1338    Visit Number  3    Number of Visits  17    Date for PT Re-Evaluation  01/06/20    Authorization Type  BCBS    PT Start Time  1225    PT Stop Time  1314    PT Time Calculation (min)  49 min    Activity Tolerance  Patient tolerated treatment well    Behavior During Therapy  [facility] for tasks assessed/performed       Past Medical History:  Diagnosis Date  . Allergy   . Anemia   . Breast cyst   . Family history of breast cancer    My Risk neg 2/17, NBN VUS  . Family history of ovarian cancer   . Increased risk of breast cancer    IBIS Lifetime risk 25.4%; MyRisk neg  . Neuromuscular disorder [facility])     Past Surgical History:  Procedure Laterality Date  . MANDIBLE SURGERY Bilateral 01/012011   nerve damage with numbness on the chin on the right side.    There were no vitals filed for this visit.  Subjective Assessment - 11/14/19 1225    Subjective  Reports she is trying to be more active over the last few days. She has been sore - but the soreness is more of a muscular soreness (not an increase in her LLE pain).    Pertinent History  L4/5 and L5/S1 herniated disc    Limitations  House hold activities;Lifting;Standing;Walking    How long can you sit comfortably?  I try to sit reclined to make myself comfortable - when sitting in this position, I can be here for a long while    How long can you stand comfortably?  5 minutes maximum    How long can you walk comfortably?  I haven't tried it    Patient Stated Goals  reduce my pain and allow return to normal life    Currently in Pain?  Yes    Pain Score  4      Pain Location  Ankle    Pain Orientation  Left    Pain Descriptors / Indicators  Shooting    Pain Type  Acute pain    Pain Onset  More than a month ago    Aggravating Factors   weight bearing, standing                       [facility] Adult PT Treatment/Exercise - 11/14/19 1230      Exercises   Exercises  Other Exercises    Other Exercises   see free text space in note for details on therapeutic exercise      Manual Therapy   Manual Therapy  --    Manual Traction  --       Therapeutic Exercise  Hooklying Tra Bracing x 10  Posterior pelvic tilt in hooklying x 10, 2 sets  Hooklying posterior pelvic tilt and bracing + unilateral hip flexion resistance x 10 bilaterally  DKTC on peanut ball + TrA bracing x 10 - aggravated L ankle pain by end of set, discontinued exercise DKTC x 10  Seated  lumbar flexion x 12 - no sx provoked in left lower extremity  Seated figure 4 stretch x 2 bilaterally - requires cueing for postural maintenance during stretch   Seated HS stretch x 2 bilaterally  Hooklying unilateral knee fall out + TrA bracing x 10 bilaterally  Bridge on peanut ball x 10 - cueing required to maintain TrA brace (responds well to cue to "pull bellybutton in")        PT Education - 11/14/19 1336    Education Details  begin to work up towards working half of her workday from a desk/table environment (instead of the couch) to being to 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 (ALL STGs DUE 12/07/19)    Time  4    Period  Weeks    Status  New    Target Date  12/07/19      PT SHORT TERM GOAL #2   Title  Patient will have >/= 10 degrees of lumbar extension in order to demonstrate  a decreased reliance on compensatory movement strategies during ADLs.    Time  4    Period  Weeks    Status  New      PT SHORT TERM GOAL #3   Title  Patient will demonstrate >/= 40 degrees of bilateral hip rotation to decrease reliance on compensatory movements during ADLs to reduce injury risk.    Time  4    Period  Weeks    Status  New      PT SHORT TERM GOAL #4   Title  Patient will report she is able to tolerate standing activities (such as meal prep) for >/= 15 minutes with use of exercise/positioning as needed to indicate improvement in ability to perform ADLs.    Time  4    Period  Weeks    Status  New        PT Long Term Goals - 11/07/19 1508      PT LONG TERM GOAL #1   Title  Patient will be independent with HEP to foster improved strength and reduced pain. (ALL LTGs DUE 01/06/20)    Time  8    Period  Weeks    Status  New    Target Date  01/06/20      PT LONG TERM GOAL #2   Title  Patient will demonstrate >/= 45 degrees of bilateral hip rotation to decrease reliance on compensatory movements during ADLs to reduce injury risk.    Time  8    Period  Weeks    Status  New      PT LONG TERM GOAL #3   Title  Patient's bilateral hip flexors, extensors, and abductors will be 5/5 to demonstrate improvement in functional mobility.    Time  8    Period  Weeks    Status  New      PT LONG TERM GOAL #4   Title  Patient will have >/= 20 degrees of lumbar extension in order to demonstrate a decreased reliance on compensatory movement strategies during ADLs.    Time  8    Period  Weeks    Status  New      PT LONG TERM GOAL #5   Title  Patient will report she is able to fully return to all work related duties with ability to verbalize changes in mechanics/ergonomics as needed to modify her work day/environment to decrease risk of further injury.    Time  8    Period  Weeks    Status  New            Plan - 11/14/19 1338    Clinical Impression Statement  Today's  skilled session focused on progression of lumbar stabilization primarily in supported positions, which she tolerated well. She appears to benefit from cueing for posterior pelvic tilt bracing and maintenance of contraction prior to progressing into hip stretch/strengthening exercise. Qualitatively, patient's functional mobility/ADLs/activity is increasing over the last week. She will benefit from continued PT in order to maximize functional mobility.    Personal Factors and Comorbidities  Comorbidity 1    Comorbidities  L4/L5 and L5/S1 disc herniation    Examination-Activity Limitations  Sit;Sleep;Lift;Squat;Locomotion Level;Stairs;Carry;Stand    Examination-Participation Restrictions  Interpersonal Relationship;Yard Work;Cleaning;Laundry;Community Activity;Shop;Driving;Meal Prep    Stability/Clinical Decision Making  Stable/Uncomplicated    Rehab Potential  Good    PT Frequency  2x / week   with plans to taper to 1x/wk if indicated in future once pain is managed   PT Duration  8 weeks    PT Treatment/Interventions  ADLs/Self Care Home Management;Aquatic Therapy;Biofeedback;Traction;Moist Heat;Electrical Stimulation;Cryotherapy;Ultrasound;DME Instruction;Gait training;Therapeutic exercise;Therapeutic activities;Functional mobility training;Stair training;Balance training;Neuromuscular re-education;Cognitive remediation;Patient/family education;Manual techniques;Passive range of motion;Energy conservation;Spinal Manipulations;Joint Manipulations    PT Next Visit Plan  continue with manual traction for decreased radicular pain/symptoms (has ortho appt on 12/04/19 to trial mechanical traction, monitor if this will still be needed as it's far out), continue with core/LE strengthening/stretching moving into more non-supported and weight bearing positions    PT Home Exercise Plan  Access Code: [email or web address]    Consulted and Agree with Plan of Care  Patient       Patient will benefit from skilled therapeutic  intervention in order to improve the following deficits and impairments:  Abnormal gait, Decreased balance, Decreased endurance, Decreased mobility, Difficulty walking, Decreased range of motion, Impaired perceived functional ability, Improper body mechanics, Decreased activity tolerance, Decreased strength, Impaired flexibility, Postural dysfunction, Pain  Visit Diagnosis: Radiculopathy, lumbar region  Pain in left hip  Stiffness of left hip, not elsewhere classified  Stiffness of right hip, not elsewhere classified  Acute midline low back pain, unspecified whether sciatica present     Problem List There are no problems to display for this patient.   Juliann Pulse, PT, DPT  11/14/2019, 1:43 PM  [facility] [address] [place], [place], [address] Phone: [phone]   Fax:  [phone]  Name: JUSTENE HINCHCLIFFE MRN: [id number] Date of Birth: 03/22/1991

## 2019-11-18 ENCOUNTER — Encounter: Payer: Self-pay | Admitting: Physical Therapy

## 2019-11-18 ENCOUNTER — Ambulatory Visit: Payer: BC Managed Care – PPO | Admitting: Physical Therapy

## 2019-11-18 ENCOUNTER — Other Ambulatory Visit: Payer: Self-pay

## 2019-11-18 DIAGNOSIS — M25651 Stiffness of right hip, not elsewhere classified: Secondary | ICD-10-CM

## 2019-11-18 DIAGNOSIS — M25652 Stiffness of left hip, not elsewhere classified: Secondary | ICD-10-CM

## 2019-11-18 DIAGNOSIS — M5416 Radiculopathy, lumbar region: Secondary | ICD-10-CM

## 2019-11-18 DIAGNOSIS — M545 Low back pain, unspecified: Secondary | ICD-10-CM

## 2019-11-18 DIAGNOSIS — M25552 Pain in left hip: Secondary | ICD-10-CM

## 2019-11-18 NOTE — Therapy (Signed)
[facility] [address] [place], [place], [address] Phone: [phone]   Fax:  [phone]  Physical Therapy Treatment  Patient Details  Name: Kelly Montoya MRN: [id number] Date of Birth: 04-28-1991 Referring Provider (PT): Rachell Cipro MD   Encounter Date: 11/18/2019  PT End of Session - 11/18/19 0722    Visit Number  4    Number of Visits  17    Date for PT Re-Evaluation  01/06/20    Authorization Type  BCBS    PT Start Time  [phone]    PT Stop Time  0800    PT Time Calculation (min)  44 min    Activity Tolerance  Patient tolerated treatment well;No increased pain    Behavior During Therapy  WFL for tasks assessed/performed       Past Medical History:  Diagnosis Date  . Allergy   . Anemia   . Breast cyst   . Family history of breast cancer    My Risk neg 2/17, NBN VUS  . Family history of ovarian cancer   . Increased risk of breast cancer    IBIS Lifetime risk 25.4%; MyRisk neg  . Neuromuscular disorder [facility])     Past Surgical History:  Procedure Laterality Date  . MANDIBLE SURGERY Bilateral 01/012011   nerve damage with numbness on the chin on the right side.    There were no vitals filed for this visit.  Subjective Assessment - 11/18/19 0720    Subjective  Had a busy weekend due to her grandmother passing away. Has not had an opportunity to work at her desk as it was just Friday when this was discussed, she has however been sitting up a table more. Does report increased right pain today in addition to the usual left side pain.    Pertinent History  L4/5 and L5/S1 herniated disc    Limitations  House hold activities;Lifting;Standing;Walking    How long can you sit comfortably?  I try to sit reclined to make myself comfortable - when sitting in this position, I can be here for a long while    How long can you stand comfortably?  5 minutes maximum    How long can you walk comfortably?  I haven't  tried it    Patient Stated Goals  reduce my pain and allow return to normal life    Currently in Pain?  Yes    Pain Score  3     Pain Location  Back    Pain Orientation  Left    Pain Descriptors / Indicators  Shooting    Pain Type  Acute pain    Pain Radiating Towards  down the left LE to the ankle    Pain Onset  More than a month ago    Pain Frequency  Constant    Aggravating Factors   certain lumbar movements    Pain Relieving Factors  muscle relaxer, OTC pain meds           [facility] Adult PT Treatment/Exercise - 11/18/19 0723      Lumbar Exercises: Stretches   Single Knee to Chest Stretch  Left;3 reps;30 seconds;Limitations;Right    Single Knee to Chest Stretch Limitations  cues on form and hold time for 3 reps each side    Double Knee to Chest Stretch  3 reps;30 seconds;Limitations    Double Knee to Chest Stretch Limitations  pt brings left knee up first, then down first due to pain  with lifting right LE first, cues on hold times.     Lower Trunk Rotation  3 reps;30 seconds;Limitations    Lower Trunk Rotation Limitations  x3 reps each way with initial cues on form/technique    Figure 4 Stretch  3 reps;30 seconds;Supine;With overpressure;Limitations    Figure 4 Stretch Limitations  left LE crossed over right, then switched for 3 more reps with right LE crossed over left LE.     Other Lumbar Stretch Exercise  Cat<>neutral spine in quadruped for 10 reps    Other Lumbar Stretch Exercise  Childs pose for 30 sec holds x 4 reps.       Lumbar Exercises: Supine   Pelvic Tilt  10 reps;5 seconds;Limitations    Pelvic Tilt Limitations  cue for abd bracing and tilt    Bent Knee Raise  10 reps;Limitations    Bent Knee Raise Limitations  cues to maintain abd bracing with each rep of LE raise    Isometric Hip Flexion  10 reps;5 seconds;Limitations    Isometric Hip Flexion Limitations  pt lifts one leg at a time to get both LE's up, then lowers one leg at a time after the hold.       Manual  Therapy   Manual Therapy  Manual Traction    Manual therapy comments  performed for decreased radicular symptoms and pain with pt reporting decreased pain with distraction holds.     Manual Traction  manual sheet traction in hooklying position for 30 sec holds for 5 reps; then in prone with pillow at abdomen for manual distraction of lumbar spine through pelvis for 30 sec holds x 5 reps.         PT Short Term Goals - 11/07/19 1517      PT SHORT TERM GOAL #1   Title  Patient will be independent with HEP to foster improved strength and reduced pain. (ALL STGs DUE 12/07/19)    Time  4    Period  Weeks    Status  New    Target Date  12/07/19      PT SHORT TERM GOAL #2   Title  Patient will have >/= 10 degrees of lumbar extension in order to demonstrate a decreased reliance on compensatory movement strategies during ADLs.    Time  4    Period  Weeks    Status  New      PT SHORT TERM GOAL #3   Title  Patient will demonstrate >/= 40 degrees of bilateral hip rotation to decrease reliance on compensatory movements during ADLs to reduce injury risk.    Time  4    Period  Weeks    Status  New      PT SHORT TERM GOAL #4   Title  Patient will report she is able to tolerate standing activities (such as meal prep) for >/= 15 minutes with use of exercise/positioning as needed to indicate improvement in ability to perform ADLs.    Time  4    Period  Weeks    Status  New        PT Long Term Goals - 11/07/19 1508      PT LONG TERM GOAL #1   Title  Patient will be independent with HEP to foster improved strength and reduced pain. (ALL LTGs DUE 01/06/20)    Time  8    Period  Weeks    Status  New    Target Date  01/06/20  PT LONG TERM GOAL #2   Title  Patient will demonstrate >/= 45 degrees of bilateral hip rotation to decrease reliance on compensatory movements during ADLs to reduce injury risk.    Time  8    Period  Weeks    Status  New      PT LONG TERM GOAL #3   Title   Patient's bilateral hip flexors, extensors, and abductors will be 5/5 to demonstrate improvement in functional mobility.    Time  8    Period  Weeks    Status  New      PT LONG TERM GOAL #4   Title  Patient will have >/= 20 degrees of lumbar extension in order to demonstrate a decreased reliance on compensatory movement strategies during ADLs.    Time  8    Period  Weeks    Status  New      PT LONG TERM GOAL #5   Title  Patient will report she is able to fully return to all work related duties with ability to verbalize changes in mechanics/ergonomics as needed to modify her work day/environment to decrease risk of further injury.    Time  8    Period  Weeks    Status  New            Plan - 11/18/19 [id number]    Clinical Impression Statement  Today's skilled session continued to focus on decreased radicular symptoms, gentle LE/core stretching and abdominal/core strengthening. Pt reporting decreased pain and symptoms at end of session. The pt is progressing toward goals and should benefit from continued PT to progress toward unmet goals.    Personal Factors and Comorbidities  Comorbidity 1    Comorbidities  L4/L5 and L5/S1 disc herniation    Examination-Activity Limitations  Sit;Sleep;Lift;Squat;Locomotion Level;Stairs;Carry;Stand    Examination-Participation Restrictions  Interpersonal Relationship;Yard Work;Cleaning;Laundry;Community Activity;Shop;Driving;Meal Prep    Stability/Clinical Decision Making  Stable/Uncomplicated    Rehab Potential  Good    PT Frequency  2x / week   with plans to taper to 1x/wk if indicated in future once pain is managed   PT Duration  8 weeks    PT Treatment/Interventions  ADLs/Self Care Home Management;Aquatic Therapy;Biofeedback;Traction;Moist Heat;Electrical Stimulation;Cryotherapy;Ultrasound;DME Instruction;Gait training;Therapeutic exercise;Therapeutic activities;Functional mobility training;Stair training;Balance training;Neuromuscular  re-education;Cognitive remediation;Patient/family education;Manual techniques;Passive range of motion;Energy conservation;Spinal Manipulations;Joint Manipulations    PT Next Visit Plan  continue with manual traction for decreased radicular pain/symptoms (has ortho appt on 11/21/19 to trial mechanical traction with Consu[facility] Bossier at [facility]). continue with core/LE strengthening/stretching moving into more non-supported and weight bearing positions    PT Home Exercise Plan  Access Code: [email or web address]    Consulted and Agree with Plan of Care  Patient       Patient will benefit from skilled therapeutic intervention in order to improve the following deficits and impairments:  Abnormal gait, Decreased balance, Decreased endurance, Decreased mobility, Difficulty walking, Decreased range of motion, Impaired perceived functional ability, Improper body mechanics, Decreased activity tolerance, Decreased strength, Impaired flexibility, Postural dysfunction, Pain  Visit Diagnosis: Radiculopathy, lumbar region  Pain in left hip  Stiffness of left hip, not elsewhere classified  Stiffness of right hip, not elsewhere classified  Acute midline low back pain, unspecified whether sciatica present     Problem List There are no problems to display for this patient.  Willow Ora, PTA, [facility] [address], [place], [place] [address] [phone] 11/18/19, 9:51 AM+ Name: CHRISTLE BRUGMAN MRN: [id number]  Date of Birth: [birth year]

## 2019-11-21 ENCOUNTER — Encounter: Payer: Self-pay | Admitting: Rehabilitation

## 2019-11-21 ENCOUNTER — Ambulatory Visit: Payer: BC Managed Care – PPO | Admitting: Rehabilitation

## 2019-11-21 ENCOUNTER — Other Ambulatory Visit: Payer: Self-pay

## 2019-11-21 DIAGNOSIS — M545 Low back pain, unspecified: Secondary | ICD-10-CM

## 2019-11-21 DIAGNOSIS — M25552 Pain in left hip: Secondary | ICD-10-CM

## 2019-11-21 DIAGNOSIS — M5416 Radiculopathy, lumbar region: Secondary | ICD-10-CM

## 2019-11-21 NOTE — Therapy (Signed)
[facility] [address] [place], [place], [address] Phone: [phone]   Fax:  [phone]  Physical Therapy Treatment  Patient Details  Name: Kelly Montoya MRN: [id number] Date of Birth: 1990/11/17 Referring Provider (PT): Rachell Cipro MD   Encounter Date: 11/21/2019  PT End of Session - 11/21/19 0850    Visit Number  5    Number of Visits  17    Date for PT Re-Evaluation  01/06/20    Authorization Type  BCBS    PT Start Time  0845    PT Stop Time  0930    PT Time Calculation (min)  45 min    Activity Tolerance  Patient tolerated treatment well;No increased pain    Behavior During Therapy  WFL for tasks assessed/performed       Past Medical History:  Diagnosis Date  . Allergy   . Anemia   . Breast cyst   . Family history of breast cancer    My Risk neg 2/17, NBN VUS  . Family history of ovarian cancer   . Increased risk of breast cancer    IBIS Lifetime risk 25.4%; MyRisk neg  . Neuromuscular disorder [facility])     Past Surgical History:  Procedure Laterality Date  . MANDIBLE SURGERY Bilateral 01/012011   nerve damage with numbness on the chin on the right side.    There were no vitals filed for this visit.  Subjective Assessment - 11/21/19 0847    Subjective  Reports still having some R sided pain.  Has been working sitting up in short bouts.  Is stretching, sometimes over stretching.    Limitations  House hold activities;Lifting;Standing;Walking    Patient Stated Goals  reduce my pain and allow return to normal life    Currently in Pain?  Yes    Pain Score  3     Pain Location  Back    Pain Orientation  Left;Right   L>R (R is just low back)   Pain Descriptors / Indicators  Shooting    Pain Type  Acute pain    Pain Radiating Towards  down to L ankle, just at R low back    Pain Onset  More than a month ago    Pain Frequency  Constant    Aggravating Factors   certain lumbar movements    Pain Relieving Factors  muscle relaxer, OTC pain meds                        [facility] Adult PT Treatment/Exercise - 11/21/19 0857      Exercises   Exercises  Other Exercises    Other Exercises   Seated on physioball:  alternating LE marching x 10 reps, UE bicep curl with overhead press with 3lbs x 10 reps, LAQ x 10 reps BLE, diagonals with 5lb weight x 10 reps on each side.  Performed dead lift motions broken into seperate components with forward flexion at hips with knees in extension and back to upright x 5 reps, forward trunk flexion with knee flexion with reach to floor and return to stand x 5 reps and finally with 10 lb kettle bell x 10 reps (kettle bell placed on 6" step to modify).   Pt tolerated all exercise very well.        Lumbar Exercises: Stretches   Active Hamstring Stretch  1 rep;60 seconds    Active Hamstring Stretch Limitations  Both sides in sitting  with strap for calf stretch also    Single Knee to Chest Stretch  Right;Left;2 reps;60 seconds    Other Lumbar Stretch Exercise  hooklying alternating marching (heel taps) x 10 reps (with a couple of secs of BLE in air for increased core activation), hookling bicycle x 2 sets of 5 reps with cues for maintaining neutral spine/post pelvic tilt      Manual Therapy   Manual Therapy  Neural Stretch    Manual therapy comments  performed to decrease inflammation at nerve root     Neural Stretch  Performed gentle pulsating distraction on LLE with use of seat belt strap.  Performed x 5 mins.               PT Education - 11/21/19 1203    Education Details  purpose of gentle distraction    Person(s) Educated  Patient    Methods  Explanation    Comprehension  Verbalized understanding       PT Short Term Goals - 11/07/19 1517      PT SHORT TERM GOAL #1   Title  Patient will be independent with HEP to foster improved strength and reduced pain. (ALL STGs DUE 12/07/19)    Time  4    Period  Weeks    Status  New     Target Date  12/07/19      PT SHORT TERM GOAL #2   Title  Patient will have >/= 10 degrees of lumbar extension in order to demonstrate a decreased reliance on compensatory movement strategies during ADLs.    Time  4    Period  Weeks    Status  New      PT SHORT TERM GOAL #3   Title  Patient will demonstrate >/= 40 degrees of bilateral hip rotation to decrease reliance on compensatory movements during ADLs to reduce injury risk.    Time  4    Period  Weeks    Status  New      PT SHORT TERM GOAL #4   Title  Patient will report she is able to tolerate standing activities (such as meal prep) for >/= 15 minutes with use of exercise/positioning as needed to indicate improvement in ability to perform ADLs.    Time  4    Period  Weeks    Status  New        PT Long Term Goals - 11/07/19 1508      PT LONG TERM GOAL #1   Title  Patient will be independent with HEP to foster improved strength and reduced pain. (ALL LTGs DUE 01/06/20)    Time  8    Period  Weeks    Status  New    Target Date  01/06/20      PT LONG TERM GOAL #2   Title  Patient will demonstrate >/= 45 degrees of bilateral hip rotation to decrease reliance on compensatory movements during ADLs to reduce injury risk.    Time  8    Period  Weeks    Status  New      PT LONG TERM GOAL #3   Title  Patient's bilateral hip flexors, extensors, and abductors will be 5/5 to demonstrate improvement in functional mobility.    Time  8    Period  Weeks    Status  New      PT LONG TERM GOAL #4   Title  Patient will have >/= 20 degrees of lumbar  extension in order to demonstrate a decreased reliance on compensatory movement strategies during ADLs.    Time  8    Period  Weeks    Status  New      PT LONG TERM GOAL #5   Title  Patient will report she is able to fully return to all work related duties with ability to verbalize changes in mechanics/ergonomics as needed to modify her work day/environment to decrease risk of further  injury.    Time  8    Period  Weeks    Status  New            Plan - 11/21/19 1245    Clinical Impression Statement  Skilled session focused on decreasing radicular symptoms with gentle pulsating distraction, flexion/core based exercises/stretching and beginning to incorporate standing exercise.  Pt tolerated well and reports feeling less pain at end of session.    Personal Factors and Comorbidities  Comorbidity 1    Comorbidities  L4/L5 and L5/S1 disc herniation    Examination-Activity Limitations  Sit;Sleep;Lift;Squat;Locomotion Level;Stairs;Carry;Stand    Examination-Participation Restrictions  Interpersonal Relationship;Yard Work;Cleaning;Laundry;Community Activity;Shop;Driving;Meal Prep    Stability/Clinical Decision Making  Stable/Uncomplicated    Rehab Potential  Good    PT Frequency  2x / week   with plans to taper to 1x/wk if indicated in future once pain is managed   PT Duration  8 weeks    PT Treatment/Interventions  ADLs/Self Care Home Management;Aquatic Therapy;Biofeedback;Traction;Moist Heat;Electrical Stimulation;Cryotherapy;Ultrasound;DME Instruction;Gait training;Therapeutic exercise;Therapeutic activities;Functional mobility training;Stair training;Balance training;Neuromuscular re-education;Cognitive remediation;Patient/family education;Manual techniques;Passive range of motion;Energy conservation;Spinal Manipulations;Joint Manipulations    PT Next Visit Plan  continue with manual traction for decreased radicular pain/symptoms (has ortho appt on 11/21/19 to trial mechanical traction with Consuello Bossier at [facility]). continue with core/LE strengthening/stretching moving into more non-supported and weight bearing positions    PT Home Exercise Plan  Access Code: [email or web address]    Consulted and Agree with Plan of Care  Patient       Patient will benefit from skilled therapeutic intervention in order to improve the following deficits and impairments:  Abnormal gait, Decreased  balance, Decreased endurance, Decreased mobility, Difficulty walking, Decreased range of motion, Impaired perceived functional ability, Improper body mechanics, Decreased activity tolerance, Decreased strength, Impaired flexibility, Postural dysfunction, Pain  Visit Diagnosis: Radiculopathy, lumbar region  Pain in left hip  Acute midline low back pain, unspecified whether sciatica present     Problem List There are no problems to display for this patient.   Cameron Sprang, PT, MPT [facility] [address] [place], [place], [address] Phone: [phone]   Fax:  [phone] 11/21/19, 12:47 PM  Name: Kelly Montoya MRN: [id number] Date of Birth: 28-May-1991

## 2019-11-24 ENCOUNTER — Ambulatory Visit: Payer: BC Managed Care – PPO | Admitting: Physical Therapy

## 2019-11-24 ENCOUNTER — Encounter: Payer: Self-pay | Admitting: Physical Therapy

## 2019-11-24 ENCOUNTER — Other Ambulatory Visit: Payer: Self-pay

## 2019-11-24 DIAGNOSIS — M25552 Pain in left hip: Secondary | ICD-10-CM

## 2019-11-24 DIAGNOSIS — M545 Low back pain, unspecified: Secondary | ICD-10-CM

## 2019-11-24 DIAGNOSIS — M5416 Radiculopathy, lumbar region: Secondary | ICD-10-CM | POA: Diagnosis not present

## 2019-11-24 NOTE — Therapy (Signed)
[facility] [place], [place], [address] Phone: [phone]   Fax:  [phone]  Physical Therapy Treatment  Patient Details  Name: Kelly Montoya MRN: [id number] Date of Birth: Jan 09, 1991 Referring Provider (PT): Rachell Cipro MD   Encounter Date: 11/24/2019  PT End of Session - 11/24/19 1128    Visit Number  6    Number of Visits  17    Date for PT Re-Evaluation  01/06/20    Authorization Type  BCBS    PT Start Time  0845    PT Stop Time  0931    PT Time Calculation (min)  46 min    Activity Tolerance  Patient tolerated treatment well    Behavior During Therapy  [facility] for tasks assessed/performed       Past Medical History:  Diagnosis Date  . Allergy   . Anemia   . Breast cyst   . Family history of breast cancer    My Risk neg 2/17, NBN VUS  . Family history of ovarian cancer   . Increased risk of breast cancer    IBIS Lifetime risk 25.4%; MyRisk neg  . Neuromuscular disorder [facility])     Past Surgical History:  Procedure Laterality Date  . MANDIBLE SURGERY Bilateral 01/012011   nerve damage with numbness on the chin on the right side.    There were no vitals filed for this visit.  Subjective Assessment - 11/24/19 0846    Subjective  No radiating leg pain today but still noting pain locally in lumbar spine region today more notable on right (usually on left). Last time she had leg pain was 3 days ago in left posterior thigh region. She does report some mild symptom ease with standing extension motion.    Pertinent History  L4/5 and L5/S1 herniated disc    Limitations  House hold activities;Lifting;Standing;Walking    Currently in Pain?  Yes    Pain Score  --   3-4/10   Pain Location  Back    Pain Orientation  Left;Right    Pain Descriptors / Indicators  Dull;Aching    Pain Type  Acute pain    Pain Onset  More than a month ago    Pain Frequency  Constant    Aggravating Factors   activity,  movement    Pain Relieving Factors  muscle relaxer, OTC pain meds                        [facility] Adult PT Treatment/Exercise - 11/24/19 0001      Lumbar Exercises: Stretches   Lower Trunk Rotation Limitations  Right LTR with legs on 55 cm P-ball x 15 reps    Standing Extension  20 reps    Prone on Elbows Stretch Limitations  2 minutes    Press Ups  2 reps;10 reps    Press Ups Limitations  prone press up on elbows    Other Lumbar Stretch Exercise  right sidelying with left trunk rotation x 15 reps      Lumbar Exercises: Supine   Bent Knee Raise  15 reps    Bridge  15 reps    Bridge Limitations  initially attempted with ball squeeze but switched to legs on bolster for improved tolerance      Modalities   Modalities  Traction      Traction   Type of Traction  Lumbar    Min (lbs)  --  started 20 lbs. but increased to 40 lbs.   Max (lbs)  --   started 50 lbs. but increased to 70 lbs.-see assessment   Hold Time  60    Rest Time  20    Time  15      Manual Therapy   Manual Therapy  Joint mobilization    Joint Mobilization  Left hip LAD oscillations grade I-III             PT Education - 11/24/19 1125    Education Details  mechanical traction, exercises/HEP    Person(s) Educated  Patient    Methods  Explanation;Demonstration;Verbal cues    Comprehension  Verbalized understanding;Returned demonstration       PT Short Term Goals - 11/07/19 1517      PT SHORT TERM GOAL #1   Title  Patient will be independent with HEP to foster improved strength and reduced pain. (ALL STGs DUE 12/07/19)    Time  4    Period  Weeks    Status  New    Target Date  12/07/19      PT SHORT TERM GOAL #2   Title  Patient will have >/= 10 degrees of lumbar extension in order to demonstrate a decreased reliance on compensatory movement strategies during ADLs.    Time  4    Period  Weeks    Status  New      PT SHORT TERM GOAL #3   Title  Patient will demonstrate >/= 40  degrees of bilateral hip rotation to decrease reliance on compensatory movements during ADLs to reduce injury risk.    Time  4    Period  Weeks    Status  New      PT SHORT TERM GOAL #4   Title  Patient will report she is able to tolerate standing activities (such as meal prep) for >/= 15 minutes with use of exercise/positioning as needed to indicate improvement in ability to perform ADLs.    Time  4    Period  Weeks    Status  New        PT Long Term Goals - 11/07/19 1508      PT LONG TERM GOAL #1   Title  Patient will be independent with HEP to foster improved strength and reduced pain. (ALL LTGs DUE 01/06/20)    Time  8    Period  Weeks    Status  New    Target Date  01/06/20      PT LONG TERM GOAL #2   Title  Patient will demonstrate >/= 45 degrees of bilateral hip rotation to decrease reliance on compensatory movements during ADLs to reduce injury risk.    Time  8    Period  Weeks    Status  New      PT LONG TERM GOAL #3   Title  Patient's bilateral hip flexors, extensors, and abductors will be 5/5 to demonstrate improvement in functional mobility.    Time  8    Period  Weeks    Status  New      PT LONG TERM GOAL #4   Title  Patient will have >/= 20 degrees of lumbar extension in order to demonstrate a decreased reliance on compensatory movement strategies during ADLs.    Time  8    Period  Weeks    Status  New      PT LONG TERM GOAL #5   Title  Patient will report she is able to fully return to all work related duties with ability to verbalize changes in mechanics/ergonomics as needed to modify her work day/environment to decrease risk of further injury.    Time  8    Period  Weeks    Status  New            Plan - 11/24/19 1129    Clinical Impression Statement  Trial lumbar mechanical traction today with good tolerance-initial trial at 50 lb. max but pt. unable to feel much of a pull so increased to 70 lbs. and pending response plan further increase up to  50% bodyweight as tolerated. No signiifcant radiating symptoms but given report mild extension bias ROM and underlying disc pathology trial extension ROM today with good tolerance. Plan await further response with performance at home for further directional preference assessment for extension vs. flexion bias.    Personal Factors and Comorbidities  Comorbidity 1    Comorbidities  L4/L5 and L5/S1 disc herniation    Examination-Activity Limitations  Sit;Sleep;Lift;Squat;Locomotion Level;Stairs;Carry;Stand    Examination-Participation Restrictions  Interpersonal Relationship;Yard Work;Cleaning;Laundry;Community Activity;Shop;Driving;Meal Prep    Stability/Clinical Decision Making  Stable/Uncomplicated    Clinical Decision Making  Low    Rehab Potential  Good    PT Frequency  2x / week   with plans to taper to 1x/week if indicated in future once pain is managed   PT Duration  8 weeks    PT Treatment/Interventions  ADLs/Self Care Home Management;Aquatic Therapy;Biofeedback;Traction;Moist Heat;Electrical Stimulation;Cryotherapy;Ultrasound;DME Instruction;Gait training;Therapeutic exercise;Therapeutic activities;Functional mobility training;Stair training;Balance training;Neuromuscular re-education;Cognitive remediation;Patient/family education;Manual techniques;Passive range of motion;Energy conservation;Spinal Manipulations;Joint Manipulations    PT Next Visit Plan  Continue POC with mechanical traction vs. manual traction as found beneficial, check response extension exercises and continue as tolerated vs. resume more flexion bias pending response, continue/progress core strengthening and stretches as tolerated    PT Home Exercise Plan  Access Code: [email or web address]    Consulted and Agree with Plan of Care  Patient       Patient will benefit from skilled therapeutic intervention in order to improve the following deficits and impairments:  Abnormal gait, Decreased balance, Decreased endurance, Decreased  mobility, Difficulty walking, Decreased range of motion, Impaired perceived functional ability, Improper body mechanics, Decreased activity tolerance, Decreased strength, Impaired flexibility, Postural dysfunction, Pain  Visit Diagnosis: Radiculopathy, lumbar region  Pain in left hip  Acute midline low back pain, unspecified whether sciatica present     Problem List There are no problems to display for this patient.   Beaulah Dinning, PT, DPT 11/24/19 11:42 AM  [facility] [address] [place], [place], [address] Phone: [phone]   Fax:  [phone]  Name: Kelly Montoya MRN: [id number] Date of Birth: 22-Aug-1990

## 2019-11-25 ENCOUNTER — Ambulatory Visit: Payer: BC Managed Care – PPO | Admitting: Physical Therapy

## 2019-11-28 ENCOUNTER — Ambulatory Visit: Payer: BC Managed Care – PPO | Admitting: Rehabilitative and Restorative Service Providers"

## 2019-11-28 ENCOUNTER — Other Ambulatory Visit: Payer: Self-pay

## 2019-11-28 ENCOUNTER — Encounter: Payer: Self-pay | Admitting: Rehabilitative and Restorative Service Providers"

## 2019-11-28 DIAGNOSIS — M25552 Pain in left hip: Secondary | ICD-10-CM

## 2019-11-28 DIAGNOSIS — M5416 Radiculopathy, lumbar region: Secondary | ICD-10-CM

## 2019-11-28 DIAGNOSIS — M545 Low back pain, unspecified: Secondary | ICD-10-CM

## 2019-11-28 DIAGNOSIS — M25651 Stiffness of right hip, not elsewhere classified: Secondary | ICD-10-CM

## 2019-11-28 DIAGNOSIS — M25652 Stiffness of left hip, not elsewhere classified: Secondary | ICD-10-CM

## 2019-11-28 NOTE — Therapy (Signed)
[facility] [address] [place], [place], [address] Phone: [phone]   Fax:  [phone]  Physical Therapy Treatment  Patient Details  Name: Kelly Montoya MRN: [id number] Date of Birth: 1990-08-30 Referring Provider (PT): Rachell Cipro MD   Encounter Date: 11/28/2019  PT End of Session - 11/28/19 1237    Visit Number  7    Number of Visits  17    Date for PT Re-Evaluation  01/06/20    Authorization Type  BCBS    PT Start Time  0925    PT Stop Time  1010    PT Time Calculation (min)  45 min    Activity Tolerance  Patient tolerated treatment well    Behavior During Therapy  [facility] for tasks assessed/performed       Past Medical History:  Diagnosis Date  . Allergy   . Anemia   . Breast cyst   . Family history of breast cancer    My Risk neg 2/17, NBN VUS  . Family history of ovarian cancer   . Increased risk of breast cancer    IBIS Lifetime risk 25.4%; MyRisk neg  . Neuromuscular disorder [facility])     Past Surgical History:  Procedure Laterality Date  . MANDIBLE SURGERY Bilateral 01/012011   nerve damage with numbness on the chin on the right side.    There were no vitals filed for this visit.  Subjective Assessment - 11/28/19 0925    Subjective  I am feeling much better at home, and am pleased. I think the traction is helping over at [facility], too. I walked to the park, and had no issues (12 minute walk). I am working on my exercises at home. I still feel some discomfort in L buttocks and L ankle. I've had some discomfort on the front of my thigh on the R side, but this is intermittent. No known triggers.    Pertinent History  L4/5 and L5/S1 herniated disc    Limitations  House hold activities;Lifting;Standing;Walking    Currently in Pain?  No/denies    Pain Onset  More than a month ago                        [facility] Adult PT Treatment/Exercise - 11/28/19 0930       Lumbar Exercises: Stretches   Lower Trunk Rotation Limitations  --    Standing Extension  --    Prone on Elbows Stretch Limitations  --    Press Ups  --    Press Ups Limitations  --    Other Lumbar Stretch Exercise  --      Lumbar Exercises: Supine   Bent Knee Raise  --    Bridge  --    Bridge Limitations  --      Modalities   Modalities  --      Traction   Type of Traction  --    Min (lbs)  --    Max (lbs)  --    Hold Time  --    Rest Time  --    Time  --      Manual Therapy   Manual Therapy  --    Joint Mobilization  --      Therapeutic Exercise  Seated pelvic tilts with cueing for TrA activation when pulling "out" of anteriorly tilted position  Prone lying x 2 minutes  Prone glute set x 10, 2  sets  Static prone on elbows x 2 minutes  Prone press ups on elbows x 10, 2 sets - requires tactile cueing for extension in lumbar spine not at thoracic-lumbar junction  SLR + maintenance of TrA brace x 10 bilaterally - requires cueing to maintain brace especially with eccentric lowering  Bridge + hip adductor isometric squeeze (pillow between knees) x 10, 2 sets  Supine heel press (knee extended) TrA bracing with contralateral multifidus activation x 10 bilaterally  Seated on large physioball (not stabilized against table, but positioned immediately in front of mat table if needed)   + pelvic tilts x 10 - benefits from tactile cueing at lumbar spine to minimize thoracic- lumbar junction extension   + scapular retraction x 10   + lateral and fwd foot taps on 2 inch block + TrA bracing x 5 bilaterally each direction - cueing required to maintain bracing contraction   +hip flex/march with 3 second hold to increase work to stabilize - cueing required to maintain bracing contraction        PT Short Term Goals - 11/07/19 1517      PT SHORT TERM GOAL #1   Title  Patient will be independent with HEP to foster improved strength and reduced pain. (ALL STGs DUE 12/07/19)    Time  4     Period  Weeks    Status  New    Target Date  12/07/19      PT SHORT TERM GOAL #2   Title  Patient will have >/= 10 degrees of lumbar extension in order to demonstrate a decreased reliance on compensatory movement strategies during ADLs.    Time  4    Period  Weeks    Status  New      PT SHORT TERM GOAL #3   Title  Patient will demonstrate >/= 40 degrees of bilateral hip rotation to decrease reliance on compensatory movements during ADLs to reduce injury risk.    Time  4    Period  Weeks    Status  New      PT SHORT TERM GOAL #4   Title  Patient will report she is able to tolerate standing activities (such as meal prep) for >/= 15 minutes with use of exercise/positioning as needed to indicate improvement in ability to perform ADLs.    Time  4    Period  Weeks    Status  New        PT Long Term Goals - 11/07/19 1508      PT LONG TERM GOAL #1   Title  Patient will be independent with HEP to foster improved strength and reduced pain. (ALL LTGs DUE 01/06/20)    Time  8    Period  Weeks    Status  New    Target Date  01/06/20      PT LONG TERM GOAL #2   Title  Patient will demonstrate >/= 45 degrees of bilateral hip rotation to decrease reliance on compensatory movements during ADLs to reduce injury risk.    Time  8    Period  Weeks    Status  New      PT LONG TERM GOAL #3   Title  Patient's bilateral hip flexors, extensors, and abductors will be 5/5 to demonstrate improvement in functional mobility.    Time  8    Period  Weeks    Status  New      PT LONG TERM GOAL #4  Title  Patient will have >/= 20 degrees of lumbar extension in order to demonstrate a decreased reliance on compensatory movement strategies during ADLs.    Time  8    Period  Weeks    Status  New      PT LONG TERM GOAL #5   Title  Patient will report she is able to fully return to all work related duties with ability to verbalize changes in mechanics/ergonomics as needed to modify her work  day/environment to decrease risk of further injury.    Time  8    Period  Weeks    Status  New            Plan - 11/28/19 1237    Clinical Impression Statement  Today's skilled session focused on progression of lumbar stabilization in both supported and non-supported seated positions (including use of physioball), which she tolerated well qualitatively demonstraing improvement in activity tolerance due to imprvement in lumbar stabilizer strength. Benefitted greatly from mechanical traction trial at [facility], with ongoing appointments 1x/wk at neuro and 1x/wk at ortho (adjustment to schedule made today). Following traction trial at last visit, now demo makred lumbar extension tolerance. Will benefit from continued PT working towards goals.    Personal Factors and Comorbidities  Comorbidity 1    Comorbidities  L4/L5 and L5/S1 disc herniation    Examination-Activity Limitations  Sit;Sleep;Lift;Squat;Locomotion Level;Stairs;Carry;Stand    Examination-Participation Restrictions  Interpersonal Relationship;Yard Work;Cleaning;Laundry;Community Activity;Shop;Driving;Meal Prep    Stability/Clinical Decision Making  Stable/Uncomplicated    Rehab Potential  Good    PT Frequency  2x / week   with plans to taper to 1x/week if indicated in future once pain is managed   PT Duration  8 weeks    PT Treatment/Interventions  ADLs/Self Care Home Management;Aquatic Therapy;Biofeedback;Traction;Moist Heat;Electrical Stimulation;Cryotherapy;Ultrasound;DME Instruction;Gait training;Therapeutic exercise;Therapeutic activities;Functional mobility training;Stair training;Balance training;Neuromuscular re-education;Cognitive remediation;Patient/family education;Manual techniques;Passive range of motion;Energy conservation;Spinal Manipulations;Joint Manipulations    PT Next Visit Plan  STGs due 12/07/19 (still have 2 scheduled appointments to check them); would likely benefit from core activation/lumbar  stabilization HEP update!, Continue POC with mechanical traction vs. manual traction as found beneficial, continue to trial extension based exercise based on qualitative improvement noted today,, continue/progress core strengthening and stretches as tolerated    PT Home Exercise Plan  Access Code: [email or web address]    Consulted and Agree with Plan of Care  Patient       Patient will benefit from skilled therapeutic intervention in order to improve the following deficits and impairments:  Abnormal gait, Decreased balance, Decreased endurance, Decreased mobility, Difficulty walking, Decreased range of motion, Impaired perceived functional ability, Improper body mechanics, Decreased activity tolerance, Decreased strength, Impaired flexibility, Postural dysfunction, Pain  Visit Diagnosis: Radiculopathy, lumbar region  Pain in left hip  Acute midline low back pain, unspecified whether sciatica present  Stiffness of right hip, not elsewhere classified  Stiffness of left hip, not elsewhere classified     Problem List There are no problems to display for this patient.   Juliann Pulse, PT, DPT  11/28/2019, 12:45 PM  [facility] [address] [place], [place], [address] Phone: [phone]   Fax:  [phone]  Name: Kelly Montoya MRN: [id number] Date of Birth: [birth year]

## 2019-12-03 ENCOUNTER — Ambulatory Visit: Payer: BC Managed Care – PPO | Admitting: Physical Therapy

## 2019-12-03 ENCOUNTER — Encounter: Payer: Self-pay | Admitting: Physical Therapy

## 2019-12-03 ENCOUNTER — Other Ambulatory Visit: Payer: Self-pay

## 2019-12-03 DIAGNOSIS — M5416 Radiculopathy, lumbar region: Secondary | ICD-10-CM

## 2019-12-03 DIAGNOSIS — M25651 Stiffness of right hip, not elsewhere classified: Secondary | ICD-10-CM

## 2019-12-03 DIAGNOSIS — M25552 Pain in left hip: Secondary | ICD-10-CM

## 2019-12-03 DIAGNOSIS — M545 Low back pain, unspecified: Secondary | ICD-10-CM

## 2019-12-03 DIAGNOSIS — M25652 Stiffness of left hip, not elsewhere classified: Secondary | ICD-10-CM

## 2019-12-03 NOTE — Therapy (Signed)
[facility] [address] [place], [place], [address] Phone: [phone]   Fax:  [phone]  Physical Therapy Treatment  Patient Details  Name: Kelly Montoya MRN: [id number] Date of Birth: March 03, 1991 Referring Provider (PT): Rachell Cipro MD   Encounter Date: 12/03/2019  PT End of Session - 12/03/19 0720    Visit Number  8    Number of Visits  17    Date for PT Re-Evaluation  01/06/20    Authorization Type  BCBS    PT Start Time  0716    PT Stop Time  0756    PT Time Calculation (min)  40 min    Activity Tolerance  Patient tolerated treatment well;No increased pain    Behavior During Therapy  WFL for tasks assessed/performed       Past Medical History:  Diagnosis Date  . Allergy   . Anemia   . Breast cyst   . Family history of breast cancer    My Risk neg 2/17, NBN VUS  . Family history of ovarian cancer   . Increased risk of breast cancer    IBIS Lifetime risk 25.4%; MyRisk neg  . Neuromuscular disorder [facility])     Past Surgical History:  Procedure Laterality Date  . MANDIBLE SURGERY Bilateral 01/012011   nerve damage with numbness on the chin on the right side.    There were no vitals filed for this visit.  Subjective Assessment - 12/03/19 0719    Subjective  No new complaints. Has been busy with school and has not had a chance to stretch since Saturday. Has been sitting up more with work and does report getting up easier. Only getting radicular symptoms once in while now.    Pertinent History  L4/5 and L5/S1 herniated disc    Limitations  House hold activities;Lifting;Standing;Walking    How long can you sit comfortably?  I try to sit reclined to make myself comfortable - when sitting in this position, I can be here for a long while    How long can you stand comfortably?  5 minutes maximum    How long can you walk comfortably?  I haven't tried it    Patient Stated Goals  reduce my pain and  allow return to normal life    Currently in Pain?  No/denies    Pain Score  0-No pain             [facility] Adult PT Treatment/Exercise - 12/03/19 0722      Lumbar Exercises: Stretches   Double Knee to Chest Stretch  3 reps;30 seconds;Limitations    Lower Trunk Rotation  3 reps;30 seconds;Limitations    Lower Trunk Rotation Limitations  legs on mat table in hooklying position    Prone on Elbows Stretch  1 rep;Limitations    Prone on Elbows Stretch Limitations  2 minutes    Press Ups  10 reps;Limitations    Press Ups Limitations  able to perform full press ups with elbow extension with hands posiitoned  just at/above head level.     Other Lumbar Stretch Exercise  Childs pose for 30 sec holds x 4 reps.       Lumbar Exercises: Seated   Other Seated Lumbar Exercises  seated on large blue pball: bouncing with empahasis on tall posture/abd bracing for 1 minutes; pelvic rocking laterally for 10 reps, then ant/post direction for 10 reps with cues on technique/abd bracing/pain free ranges; lateral reaching up to one  side with contralateral hip elevation on opposite side for trunk elongation/stretching x 10 reps; forward cross body reaching with contralteral rolling of pelivis in opposite direction for lumbar stretching x 10 reps each way.                          Lumbar Exercises: Supine   Pelvic Tilt  10 reps;5 seconds;Limitations    Pelvic Tilt Limitations  cue for abd bracing and tilt    Bridge  15 reps;Non-compliant;5 seconds;Limitations    Bridge Limitations  with yoga block squeeze between knees, cues for hold times with arms on mat by sides      Lumbar Exercises: Prone   Opposite Arm/Leg Raise  Right arm/Left leg;Left arm/Right leg;10 reps;3 seconds;Limitations    Opposite Arm/Leg Raise Limitations  limited lift on right due to mild symptoms into buttocks that improved as reps progressed.       Manual Therapy   Manual Therapy  Manual Traction    Manual Traction  manual sheet traction  in hooklying for 30 sec holds x 8 reps with decreased tightness reported.           PT Short Term Goals - 11/07/19 1517      PT SHORT TERM GOAL #1   Title  Patient will be independent with HEP to foster improved strength and reduced pain. (ALL STGs DUE 12/07/19)    Time  4    Period  Weeks    Status  New    Target Date  12/07/19      PT SHORT TERM GOAL #2   Title  Patient will have >/= 10 degrees of lumbar extension in order to demonstrate a decreased reliance on compensatory movement strategies during ADLs.    Time  4    Period  Weeks    Status  New      PT SHORT TERM GOAL #3   Title  Patient will demonstrate >/= 40 degrees of bilateral hip rotation to decrease reliance on compensatory movements during ADLs to reduce injury risk.    Time  4    Period  Weeks    Status  New      PT SHORT TERM GOAL #4   Title  Patient will report she is able to tolerate standing activities (such as meal prep) for >/= 15 minutes with use of exercise/positioning as needed to indicate improvement in ability to perform ADLs.    Time  4    Period  Weeks    Status  New        PT Long Term Goals - 11/07/19 1508      PT LONG TERM GOAL #1   Title  Patient will be independent with HEP to foster improved strength and reduced pain. (ALL LTGs DUE 01/06/20)    Time  8    Period  Weeks    Status  New    Target Date  01/06/20      PT LONG TERM GOAL #2   Title  Patient will demonstrate >/= 45 degrees of bilateral hip rotation to decrease reliance on compensatory movements during ADLs to reduce injury risk.    Time  8    Period  Weeks    Status  New      PT LONG TERM GOAL #3   Title  Patient's bilateral hip flexors, extensors, and abductors will be 5/5 to demonstrate improvement in functional mobility.    Time  8  Period  Weeks    Status  New      PT LONG TERM GOAL #4   Title  Patient will have >/= 20 degrees of lumbar extension in order to demonstrate a decreased reliance on compensatory  movement strategies during ADLs.    Time  8    Period  Weeks    Status  New      PT LONG TERM GOAL #5   Title  Patient will report she is able to fully return to all work related duties with ability to verbalize changes in mechanics/ergonomics as needed to modify her work day/environment to decrease risk of further injury.    Time  8    Period  Weeks    Status  New          Plan - 12/03/19 1006    Clinical Impression Statement  Today's skilled session continued to focus on decreased radicular symptoms and core stretching/strengthening. Changed between flexion and extension ex's/stretches with minimal symptoms into the right buttocks reportes at times that resolved as reps progressed. Pt reported feeling less tight at end of session as well. The pt is progressing toward goals and should benefit from continued PT to progress toward unmet goals.    Personal Factors and Comorbidities  Comorbidity 1    Comorbidities  L4/L5 and L5/S1 disc herniation    Examination-Activity Limitations  Sit;Sleep;Lift;Squat;Locomotion Level;Stairs;Carry;Stand    Examination-Participation Restrictions  Interpersonal Relationship;Yard Work;Cleaning;Laundry;Community Activity;Shop;Driving;Meal Prep    Stability/Clinical Decision Making  Stable/Uncomplicated    Rehab Potential  Good    PT Frequency  2x / week   with plans to taper to 1x/week if indicated in future once pain is managed   PT Duration  8 weeks    PT Treatment/Interventions  ADLs/Self Care Home Management;Aquatic Therapy;Biofeedback;Traction;Moist Heat;Electrical Stimulation;Cryotherapy;Ultrasound;DME Instruction;Gait training;Therapeutic exercise;Therapeutic activities;Functional mobility training;Stair training;Balance training;Neuromuscular re-education;Cognitive remediation;Patient/family education;Manual techniques;Passive range of motion;Energy conservation;Spinal Manipulations;Joint Manipulations    PT Next Visit Plan  STGs due 12/07/19  would  likely benefit from core activation/lumbar stabilization HEP update!, Continue POC with mechanical traction vs. manual traction as found beneficial, continue to trial extension based exercise based on qualitative improvement noted today,, continue/progress core strengthening and stretches as tolerated    PT Home Exercise Plan  Access Code: [email or web address]    Consulted and Agree with Plan of Care  Patient       Patient will benefit from skilled therapeutic intervention in order to improve the following deficits and impairments:  Abnormal gait, Decreased balance, Decreased endurance, Decreased mobility, Difficulty walking, Decreased range of motion, Impaired perceived functional ability, Improper body mechanics, Decreased activity tolerance, Decreased strength, Impaired flexibility, Postural dysfunction, Pain  Visit Diagnosis: Radiculopathy, lumbar region  Pain in left hip  Acute midline low back pain, unspecified whether sciatica present  Stiffness of right hip, not elsewhere classified  Stiffness of left hip, not elsewhere classified     Problem List There are no problems to display for this patient.   Willow Ora, PTA, [place] [address], [place], [place] [address] [phone] 12/03/19, 10:11 AM   Name: Kelly Montoya MRN: [id number] Date of Birth: 18-Apr-1991

## 2019-12-05 ENCOUNTER — Other Ambulatory Visit: Payer: Self-pay

## 2019-12-05 ENCOUNTER — Ambulatory Visit: Payer: BC Managed Care – PPO | Admitting: Rehabilitative and Restorative Service Providers"

## 2019-12-05 ENCOUNTER — Encounter: Payer: Self-pay | Admitting: Rehabilitative and Restorative Service Providers"

## 2019-12-05 DIAGNOSIS — M25552 Pain in left hip: Secondary | ICD-10-CM

## 2019-12-05 DIAGNOSIS — R2689 Other abnormalities of gait and mobility: Secondary | ICD-10-CM

## 2019-12-05 DIAGNOSIS — M5416 Radiculopathy, lumbar region: Secondary | ICD-10-CM | POA: Diagnosis not present

## 2019-12-05 DIAGNOSIS — M25651 Stiffness of right hip, not elsewhere classified: Secondary | ICD-10-CM

## 2019-12-05 DIAGNOSIS — M25652 Stiffness of left hip, not elsewhere classified: Secondary | ICD-10-CM

## 2019-12-05 NOTE — Therapy (Signed)
[facility] [address] [place], [place], [address] Phone: [phone]   Fax:  [phone]  Physical Therapy Treatment  Patient Details  Name: Kelly Montoya MRN: [id number] Date of Birth: 06-17-1991 Referring Provider (PT): Rachell Cipro MD   Encounter Date: 12/05/2019  PT End of Session - 12/05/19 1208    Visit Number  9    Number of Visits  17    Date for PT Re-Evaluation  01/06/20    Authorization Type  BCBS    PT Start Time  0929    PT Stop Time  9407    PT Time Calculation (min)  46 min    Activity Tolerance  Patient tolerated treatment well;No increased pain    Behavior During Therapy  WFL for tasks assessed/performed       Past Medical History:  Diagnosis Date  . Allergy   . Anemia   . Breast cyst   . Family history of breast cancer    My Risk neg 2/17, NBN VUS  . Family history of ovarian cancer   . Increased risk of breast cancer    IBIS Lifetime risk 25.4%; MyRisk neg  . Neuromuscular disorder [facility])     Past Surgical History:  Procedure Laterality Date  . MANDIBLE SURGERY Bilateral 01/012011   nerve damage with numbness on the chin on the right side.    There were no vitals filed for this visit.  Subjective Assessment - 12/05/19 0929    Subjective  Patient reports she has been very busy lately with work. I've been feeling good overall. I've been trying to pay attention to how I'm sitting. I've not been taking muscle relaxers to help with sleep or pain management anymore. 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    Pertinent History  L4/5 and L5/S1 herniated disc    Limitations  House hold activities;Lifting;Standing;Walking    How long can you sit comfortably?  I try to sit reclined to make myself comfortable - when sitting in this position, I can be here  for a long while    How long can you stand comfortably?  5 minutes maximum    How long can you walk comfortably?  I haven't tried it    Patient Stated Goals  reduce my pain and allow return to normal life    Currently in Pain?  No/denies         [facility] PT Assessment - 12/05/19 0938      AROM   AROM Assessment Site  Lumbar    Right/Left Hip  Right;Left    Right Hip External Rotation   46    Left Hip External Rotation   45    Lumbar Extension  23                    [facility] Adult PT Treatment/Exercise - 12/05/19 0934      Lumbar Exercises: Stretches   Double Knee to Chest Stretch  --    Lower Trunk Rotation  --    Lower Trunk Rotation Limitations  --    Prone on Elbows Stretch  1 rep;Limitations    Prone on Elbows Stretch Limitations  2 minutes    Press Ups  10 reps;Limitations    Press Ups Limitations  performed full press up with nearly full  elbow extension hands at head level    Other Lumbar Stretch Exercise  --      Lumbar Exercises: Seated   Other Seated Lumbar Exercises  Seated on blue physioball: pelvic tilts x 15 requires cueing for extension motion from lumbar spine (minimize lower thoracic spine ext). marching + TrA bracing x 10 bilaterally - progressed to same exercise with YTB, TrA bracing + B overhead reaching/shoulder flex x 15 requiring cueing to maintain core bracing/stabilization throughout. Progressed to opposite leg/hand (leg march, shoulder flex) + TrA bracing x 12 requiring postural awareness cueing throughout. Hip adductor isometric with pillow squeeze with core stabilization cueing required x 15. Scapular retraction + YTB with cueing to maintain proper posture.       Lumbar Exercises: Supine   Pelvic Tilt  --    Pelvic Tilt Limitations  --    Bridge  --    Bridge Limitations  --    Other Supine Lumbar Exercises  multifidus strengthening via supine single limb staight heel press into mat table with cueing to maintain lumbar spine neural against mat  table x 10, 3 sec hold bilaterally. fatiues easier when left sided stabilizers active      Lumbar Exercises: Prone   Straight Leg Raise  10 reps;3 seconds    Straight Leg Raises Limitations  requires cueing for control especially eccentrically when lowering    Opposite Arm/Leg Raise  --    Opposite Arm/Leg Raise Limitations  --      Manual Therapy   Manual Therapy  --    Manual Traction  --             PT Education - 12/05/19 1207    Education Details  update to HEP (see patient instructions)    Person(s) Educated  Patient    Methods  Explanation;Demonstration;Tactile cues;Verbal cues;Handout    Comprehension  Verbalized understanding;Returned demonstration;Need further instruction       PT Short Term Goals - 12/05/19 0934      PT SHORT TERM GOAL #1   Title  Patient will be independent with HEP to foster improved strength and reduced pain. 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    Baseline  MET 12/05/19: lumbar extension = 23 degrees    Time  4    Period  Weeks    Status  Achieved      PT SHORT TERM GOAL #3   Title  Patient will demonstrate >/= 40 degrees of bilateral hip rotation to decrease reliance on compensatory movements during ADLs to reduce injury risk.    Baseline  MET 12/05/19: L = 45 degrees, R = 46 degrees    Time  4    Period  Weeks    Status  Achieved      PT SHORT TERM GOAL #4   Title  Patient will report she is able to tolerate standing activities (such as meal  prep) for >/= 15 minutes  with use of exercise/positioning as needed to indicate improvement in ability to perform ADLs.    Baseline  MET 12/05/19: patient reports she is able to stand for long enough periods of time to get ready at bathroom sink and meal preps for > 15 minutes with no issues    Time  4    Period  Weeks    Status  Achieved        PT Long Term Goals - 11/07/19 1508      PT LONG TERM GOAL #1   Title  Patient will be independent with HEP to foster improved strength and reduced pain. (ALL LTGs DUE 01/06/20)    Time  8    Period  Weeks    Status  New    Target Date  01/06/20      PT LONG TERM GOAL #2   Title  Patient will demonstrate >/= 45 degrees of bilateral hip rotation to decrease reliance on compensatory movements during ADLs to reduce injury risk.    Time  8    Period  Weeks    Status  New      PT LONG TERM GOAL #3   Title  Patient's bilateral hip flexors, extensors, and abductors will be 5/5 to demonstrate improvement in functional mobility.    Time  8    Period  Weeks    Status  New      PT LONG TERM GOAL #4   Title  Patient will have >/= 20 degrees of lumbar extension in order to demonstrate a decreased reliance on compensatory movement strategies during ADLs.    Time  8    Period  Weeks    Status  New      PT LONG TERM GOAL #5   Title  Patient will report she is able to fully return to all work related duties with ability to verbalize changes in mechanics/ergonomics as needed to modify her work day/environment to decrease risk of further injury.    Time  8    Period  Weeks    Status  New            Plan - 12/05/19 1208    Clinical Impression Statement  Today's skilled session focused initially of STG assessment, and patient has met all 4 STGs indicating improvement in activity tolerance, hip range of motion and lumbar spine range of motion. Functionally she is much improved from baseline as she is now able to complete larger portions of her day seated at desk/table and is  gradually increasing her activity level back to her former norm. Additionally, in light of progress made, PT updated patient's HEP to include lumbar extension ROM and a progression of both hip and core stabilization. She is demonstrating a great increase in progress and will benefit from continued skilled PT working towards her [place].    Personal Factors and Comorbidities  Comorbidity 1    Comorbidities  L4/L5 and L5/S1 disc herniation    Examination-Activity Limitations  Sit;Sleep;Lift;Squat;Locomotion Level;Stairs;Carry;Stand    Examination-Participation Restrictions  Interpersonal Relationship;Yard Work;Cleaning;Laundry;Community Activity;Shop;Driving;Meal Prep    Stability/Clinical Decision Making  Stable/Uncomplicated    Rehab Potential  Good    PT Frequency  2x / week   with plans to taper to 1x/week if indicated in future once pain is managed   PT Duration  8 weeks    PT Treatment/Interventions  ADLs/Self Care Home Management;Aquatic Therapy;Biofeedback;Traction;Moist Heat;Electrical Stimulation;Cryotherapy;Ultrasound;DME Instruction;Gait training;Therapeutic exercise;Therapeutic activities;Functional mobility  training;Stair training;Balance training;Neuromuscular re-education;Cognitive remediation;Patient/family education;Manual techniques;Passive range of motion;Energy conservation;Spinal Manipulations;Joint Manipulations    PT Next Visit Plan  Continue POC with mechanical traction vs. manual traction as found beneficial, continue to foster lumbar extension directional movement, continue/progress core strengthening and stretches as tolerated    PT Home Exercise Plan  Access Code: [email or web address]    Consulted and Agree with Plan of Care  Patient       Patient will benefit from skilled therapeutic intervention in order to improve the following deficits and impairments:  Abnormal gait, Decreased balance, Decreased endurance, Decreased mobility, Difficulty walking, Decreased range of motion, Impaired  perceived functional ability, Improper body mechanics, Decreased activity tolerance, Decreased strength, Impaired flexibility, Postural dysfunction, Pain  Visit Diagnosis: Pain in left hip  Stiffness of left hip, not elsewhere classified  Stiffness of right hip, not elsewhere classified  Other abnormalities of gait and mobility     Problem List There are no problems to display for this patient.   Juliann Pulse, PT, DPT  12/05/2019, 12:13 PM  [facility] [address] [place], [place], [address] Phone: [phone]   Fax:  [phone]  Name: TANDA MORRISSEY MRN: [id number] Date of Birth: June 23, 1991

## 2019-12-05 NOTE — Patient Instructions (Signed)
Access Code: [email or web address] URL: [email or web address] Date: 12/05/2019 Prepared by: Rosanne Ashing  Exercises Prone Hip Extension - 2 x daily - 7 x weekly - 2 sets - 10 reps Prone Press Up - 4-5 x daily - 7 x weekly - 2 sets - 10 reps Supine Multifidus with Heel Press - 2 x daily - 7 x weekly - 2 sets - 10 reps - 3-5 seconds hold Pelvic Tilt on Swiss Ball - 2 x daily - 7 x weekly - 2 sets - 10 reps Seated March with Opposite Arm Flexion on Swiss Ball - 2 x daily - 7 x weekly - 2 sets - 10 reps

## 2019-12-09 ENCOUNTER — Ambulatory Visit: Payer: BC Managed Care – PPO | Admitting: Physical Therapy

## 2019-12-11 ENCOUNTER — Encounter: Payer: Self-pay | Admitting: Physical Therapy

## 2019-12-11 ENCOUNTER — Ambulatory Visit: Payer: BC Managed Care – PPO | Attending: Family Medicine | Admitting: Physical Therapy

## 2019-12-11 ENCOUNTER — Other Ambulatory Visit: Payer: Self-pay

## 2019-12-11 DIAGNOSIS — M25651 Stiffness of right hip, not elsewhere classified: Secondary | ICD-10-CM | POA: Diagnosis present

## 2019-12-11 DIAGNOSIS — M545 Low back pain, unspecified: Secondary | ICD-10-CM

## 2019-12-11 DIAGNOSIS — R2689 Other abnormalities of gait and mobility: Secondary | ICD-10-CM

## 2019-12-11 DIAGNOSIS — M5416 Radiculopathy, lumbar region: Secondary | ICD-10-CM | POA: Insufficient documentation

## 2019-12-11 DIAGNOSIS — M25552 Pain in left hip: Secondary | ICD-10-CM

## 2019-12-11 DIAGNOSIS — M25652 Stiffness of left hip, not elsewhere classified: Secondary | ICD-10-CM | POA: Insufficient documentation

## 2019-12-11 NOTE — Therapy (Signed)
[facility] [place], [place], [address] Phone: [phone]   Fax:  [phone]  Physical Therapy Treatment  Patient Details  Name: Kelly Montoya MRN: [id number] Date of Birth: Nov 29, 1990 Referring Provider (PT): Rachell Cipro MD   Encounter Date: 12/11/2019  PT End of Session - 12/11/19 0929    Visit Number  10    Number of Visits  17    Date for PT Re-Evaluation  01/06/20    Authorization Type  BCBS    PT Start Time  0847    PT Stop Time  0929   19 min direct tx. time   PT Time Calculation (min)  42 min    Activity Tolerance  Patient tolerated treatment well;No increased pain    Behavior During Therapy  WFL for tasks assessed/performed       Past Medical History:  Diagnosis Date   Allergy    Anemia    Breast cyst    Family history of breast cancer    My Risk neg 2/17, NBN VUS   Family history of ovarian cancer    Increased risk of breast cancer    IBIS Lifetime risk 25.4%; MyRisk neg   Neuromuscular disorder [facility])     Past Surgical History:  Procedure Laterality Date   MANDIBLE SURGERY Bilateral 01/012011   nerve damage with numbness on the chin on the right side.    There were no vitals filed for this visit.  Subjective Assessment - 12/11/19 0852    Subjective  Pt. reports continued progress with decreasing lumbar symptoms. Had some instances of LLE radiating pain over last week but these episodes are decreasing. No pain pre-tx.    Pertinent History  L4/5 and L5/S1 herniated disc    Limitations  House hold activities;Lifting;Standing;Walking    How long can you sit comfortably?  I try to sit reclined to make myself comfortable - when sitting in this position, I can be here for a long while    Patient Stated Goals  reduce my pain and allow return to normal life    Currently in Pain?  No/denies                        [facility] Adult PT Treatment/Exercise - 12/11/19 0001       Exercises   Exercises  Lumbar      Lumbar Exercises: Stretches   Other Lumbar Stretch Exercise  seated nerve floss LLE 2x10      Lumbar Exercises: Standing   Shoulder Extension Limitations  Freemtion cable ext bilat. UE 7 lbs. 2x10    Other Standing Lumbar Exercises  cable pall off press 10 lbs. x 15 reps ea. way bilat.      Lumbar Exercises: Prone   Other Prone Lumbar Exercises  PPU on 65 cm P-ball 2x10    Other Prone Lumbar Exercises  Prone on 65 cm P-ball alt UE/LE raises 2x10      Traction   Type of Traction  Lumbar    Min (lbs)  50    Max (lbs)  90    Hold Time  60    Rest Time  20    Time  15               PT Short Term Goals - 12/05/19 0934      PT SHORT TERM GOAL #1   Title  Patient will be independent with HEP to foster improved  strength and reduced pain. 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    Baseline  MET 12/05/19: lumbar extension = 23 degrees    Time  4    Period  Weeks    Status  Achieved      PT SHORT TERM GOAL #3   Title  Patient will demonstrate >/= 40 degrees of bilateral hip rotation to decrease reliance on compensatory movements during ADLs to reduce injury risk.    Baseline  MET 12/05/19: L = 45 degrees, R = 46 degrees    Time  4    Period  Weeks    Status  Achieved      PT SHORT TERM GOAL #4   Title  Patient will report she is able to tolerate standing activities (such as meal prep) for >/= 15 minutes with use of exercise/positioning as needed to indicate improvement in  ability to perform ADLs.    Baseline  MET 12/05/19: patient reports she is able to stand for long enough periods of time to get ready at bathroom sink and meal preps for > 15 minutes with no issues    Time  4    Period  Weeks    Status  Achieved        PT Long Term Goals - 11/07/19 1508      PT LONG TERM GOAL #1   Title  Patient will be independent with HEP to foster improved strength and reduced pain. (ALL LTGs DUE 01/06/20)    Time  8    Period  Weeks    Status  New    Target Date  01/06/20      PT LONG TERM GOAL #2   Title  Patient will demonstrate >/= 45 degrees of bilateral hip rotation to decrease reliance on compensatory movements during ADLs to reduce injury risk.    Time  8    Period  Weeks    Status  New      PT LONG TERM GOAL #3   Title  Patient's bilateral hip flexors, extensors, and abductors will be 5/5 to demonstrate improvement in functional mobility.    Time  8    Period  Weeks    Status  New      PT LONG TERM GOAL #4   Title  Patient will have >/= 20 degrees of lumbar extension in order to demonstrate a decreased reliance on compensatory movement strategies during ADLs.    Time  8    Period  Weeks    Status  New      PT LONG TERM GOAL #5   Title  Patient will report she is able to fully return to all work related duties with ability to verbalize changes in mechanics/ergonomics as needed to modify her work day/environment to decrease risk of further injury.    Time  8    Period  Weeks  Status  New            Plan - 12/11/19 0901    Clinical Impression Statement  Pt. continues to improve from previous status with decreased lumbar/LLE radicular symptoms. Included mechanical traction today again and progressed to 90 lbs. with good tolerance. Continued extension bias exercise progression along with core stabilization also with good tolerance/continued progres re: therapy goals.    Personal Factors and Comorbidities  Comorbidity 1    Comorbidities   L4/L5 and L5/S1 disc herniation    Examination-Activity Limitations  Sit;Sleep;Lift;Squat;Locomotion Level;Stairs;Carry;Stand    Examination-Participation Restrictions  Interpersonal Relationship;Yard Work;Cleaning;Laundry;Community Activity;Shop;Driving;Meal Prep    Stability/Clinical Decision Making  Stable/Uncomplicated    Clinical Decision Making  Low    Rehab Potential  Good    PT Frequency  2x / week    PT Duration  8 weeks    PT Treatment/Interventions  ADLs/Self Care Home Management;Aquatic Therapy;Biofeedback;Traction;Moist Heat;Electrical Stimulation;Cryotherapy;Ultrasound;DME Instruction;Gait training;Therapeutic exercise;Therapeutic activities;Functional mobility training;Stair training;Balance training;Neuromuscular re-education;Cognitive remediation;Patient/family education;Manual techniques;Passive range of motion;Energy conservation;Spinal Manipulations;Joint Manipulations    PT Next Visit Plan  Continue POC with mechanical traction vs. manual traction as found beneficial, continue to foster lumbar extension directional movement, continue/progress core strengthening and stretches as tolerated    PT Home Exercise Plan  Access Code: [email or web address]    Consulted and Agree with Plan of Care  Patient       Patient will benefit from skilled therapeutic intervention in order to improve the following deficits and impairments:  Abnormal gait, Decreased balance, Decreased endurance, Decreased mobility, Difficulty walking, Decreased range of motion, Impaired perceived functional ability, Improper body mechanics, Decreased activity tolerance, Decreased strength, Impaired flexibility, Postural dysfunction, Pain  Visit Diagnosis: Pain in left hip  Radiculopathy, lumbar region  Acute midline low back pain, unspecified whether sciatica present  Stiffness of left hip, not elsewhere classified  Stiffness of right hip, not elsewhere classified  Other abnormalities of gait and  mobility     Problem List There are no problems to display for this patient.   Beaulah Dinning, PT, DPT 12/11/19 9:31 AM  [facility] [address] [place], [place], [address] Phone: [phone]   Fax:  [phone]  Name: Kelly Montoya MRN: [id number] Date of Birth: 03/06/1991

## 2019-12-12 ENCOUNTER — Ambulatory Visit: Payer: BC Managed Care – PPO | Admitting: Rehabilitative and Restorative Service Providers"

## 2019-12-12 DIAGNOSIS — M25651 Stiffness of right hip, not elsewhere classified: Secondary | ICD-10-CM

## 2019-12-12 DIAGNOSIS — M25652 Stiffness of left hip, not elsewhere classified: Secondary | ICD-10-CM

## 2019-12-12 DIAGNOSIS — R2689 Other abnormalities of gait and mobility: Secondary | ICD-10-CM

## 2019-12-12 DIAGNOSIS — M25552 Pain in left hip: Secondary | ICD-10-CM

## 2019-12-12 NOTE — Therapy (Signed)
[facility] [address] [place], [place], [address] Phone: [phone]   Fax:  [phone]  Physical Therapy Treatment  Patient Details  Name: Kelly Montoya MRN: [id number] Date of Birth: 06-19-1991 Referring Provider (PT): Rachell Cipro MD   Encounter Date: 12/12/2019  PT End of Session - 12/12/19 0956    Visit Number  11    Number of Visits  17    Date for PT Re-Evaluation  01/06/20    Authorization Type  BCBS    PT Start Time  0802    PT Stop Time  0848    PT Time Calculation (min)  46 min    Activity Tolerance  Patient tolerated treatment well;No increased pain    Behavior During Therapy  WFL for tasks assessed/performed       Past Medical History:  Diagnosis Date  . Allergy   . Anemia   . Breast cyst   . Family history of breast cancer    My Risk neg 2/17, NBN VUS  . Family history of ovarian cancer   . Increased risk of breast cancer    IBIS Lifetime risk 25.4%; MyRisk neg  . Neuromuscular disorder [facility])     Past Surgical History:  Procedure Laterality Date  . MANDIBLE SURGERY Bilateral 01/012011   nerve damage with numbness on the chin on the right side.    There were no vitals filed for this visit.  Subjective Assessment - 12/12/19 0802    Subjective  Reports that she is in the last week of work before summer break. She has been going back for half days at work this week, and it has been going well. [employer] was graduation practice and she had more standing/walking than usual, and she had some pain in her L buttocks and intermittently in her ankle.    Pertinent History  L4/5 and L5/S1 herniated disc    Limitations  House hold activities;Lifting;Standing;Walking    How long can you sit comfortably?  I try to sit reclined to make myself comfortable - when sitting in this position, I can be here for a long while    Patient Stated Goals  reduce my pain and allow return to normal life     Currently in Pain?  Yes    Pain Score  2     Pain Location  Back    Pain Orientation  Left   Buttocks   Pain Descriptors / Indicators  Aching;Dull    Pain Onset  More than a month ago    Pain Frequency  Constant                        [facility] Adult PT Treatment/Exercise - 12/12/19 0807      Exercises   Exercises  Lumbar;Knee/Hip      Lumbar Exercises: Stretches   Other Lumbar Stretch Exercise  Seated nerve glides working through progressive neural tension from cervical spine to ankle motion - notable increase in mobility/tolerance to motion on right lower extremity; however, with multiple sets performed (in conjunction with SLR exercise noted elsewhere in therapeutic exercise) - qualitative improvement in tolerance to tension in LLE x 10, 3 sets bilaterally       Lumbar Exercises: Standing   Other Standing Lumbar Exercises  Lumbar extension in standing against counter x 10, 2 sets - clear centralization of symptoms from L buttocks to L side of lumbar spine - cueing to focus extension motion  in lumbar spine not at t/l junction      Lumbar Exercises: Seated   Other Seated Lumbar Exercises  pelvic tilt x 15, 2 sets - cueing to maximize extension in lumbar spine and pelvis not at T/L junction      Knee/Hip Exercises: Supine   Straight Leg Raises  Strengthening;Right;Left;2 sets;10 reps   used for nerve glides and strengthening    Straight Leg Raises Limitations  cueing to maintain lumbar brace in addition to maintaining maximal knee extension and ankle DF to increase tension on nerve      Knee/Hip Exercises: Prone   Hip Extension  Strengthening;Right;Left;2 sets;10 reps   alternating   Hip Extension Limitations  cueing to focus on glute engagement with 3 second isometric hold at end range             PT Education - 12/12/19 0956    Education Details  added one exercise to HEP update made last week (see patient instructions - bolded exercise is new addition)     Person(s) Educated  Patient    Methods  Explanation;Demonstration;Tactile cues;Verbal cues;Handout    Comprehension  Verbalized understanding;Returned demonstration;Need further instruction       PT Short Term Goals - 12/05/19 0934      PT SHORT TERM GOAL #1   Title  Patient will be independent with HEP to foster improved strength and reduced pain. 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    Baseline  MET 12/05/19: lumbar extension = 23 degrees    Time  4    Period  Weeks    Status  Achieved      PT SHORT TERM GOAL #3   Title  Patient will demonstrate >/= 40 degrees of bilateral hip rotation to decrease reliance on compensatory movements during ADLs to reduce injury risk.    Baseline  MET 12/05/19: L = 45 degrees, R = 46 degrees    Time  4    Period  Weeks    Status  Achieved      PT SHORT TERM GOAL #4   Title  Patient will report she is able to tolerate standing activities (such as meal prep) for >/= 15 minutes with use of exercise/positioning as needed to indicate improvement in ability to perform ADLs.    Baseline  MET 12/05/19: patient reports she is able to stand for long enough periods of time to get ready at bathroom sink and meal preps for > 15 minutes with no issues    Time  4    Period  Weeks    Status  Achieved        PT Long Term Goals - 11/07/19 1508      PT LONG TERM GOAL #1   Title  Patient will be independent with HEP to foster improved strength and reduced pain. (ALL LTGs DUE 01/06/20)    Time  8  Period  Weeks    Status  New    Target Date  01/06/20      PT LONG TERM GOAL #2   Title  Patient will demonstrate >/= 45 degrees of bilateral hip rotation to decrease reliance on compensatory movements during ADLs to reduce injury risk.    Time  8    Period  Weeks    Status  New      PT LONG TERM GOAL #3   Title  Patient's bilateral hip flexors, extensors, and abductors will be 5/5 to demonstrate improvement in functional mobility.    Time  8    Period  Weeks    Status  New      PT LONG TERM GOAL #4   Title  Patient will have >/= 20 degrees of lumbar extension in order to demonstrate a decreased reliance on compensatory movement strategies during ADLs.    Time  8    Period  Weeks    Status  New      PT LONG TERM GOAL #5   Title  Patient will report she is able to fully return to all work related duties with ability to verbalize changes in mechanics/ergonomics as needed to modify her work day/environment to decrease risk of further injury.    Time  8    Period  Weeks    Status  New            Plan - 12/12/19 0957    Clinical Impression Statement  Today's skilled session focused on progressive increase in neural tension tolerance, and she responded very well; however, continues to demonstrate asymmetry in tolerance (increased tolerance to tension consistently noted on right side). Continues to benefit from and tolerate well lumbar extension especially in standing with support and traction utilized at [facility] (performed yesterday). Is progressing in activity tolerance overall (even functionally with work), but continues to demonstrate lumbar stabilizer endurance fatigue when performing lower extremity motion exercise - especially in maintaining lumbar stabilization in conjunction with lower extremity eccentric control. She is making progress and will benefit from continued skilled PT working towards [place].    Personal Factors and Comorbidities  Comorbidity 1     Comorbidities  L4/L5 and L5/S1 disc herniation    Examination-Activity Limitations  Sit;Sleep;Lift;Squat;Locomotion Level;Stairs;Carry;Stand    Examination-Participation Restrictions  Interpersonal Relationship;Yard Work;Cleaning;Laundry;Community Activity;Shop;Driving;Meal Prep    Stability/Clinical Decision Making  Stable/Uncomplicated    Rehab Potential  Good    PT Frequency  2x / week    PT Duration  8 weeks    PT Treatment/Interventions  ADLs/Self Care Home Management;Aquatic Therapy;Biofeedback;Traction;Moist Heat;Electrical Stimulation;Cryotherapy;Ultrasound;DME Instruction;Gait training;Therapeutic exercise;Therapeutic activities;Functional mobility training;Stair training;Balance training;Neuromuscular re-education;Cognitive remediation;Patient/family education;Manual techniques;Passive range of motion;Energy conservation;Spinal Manipulations;Joint Manipulations    PT Next Visit Plan  Continue POC with mechanical traction vs. manual traction as found beneficial, continue to foster lumbar extension directional movement, continue/progress core strengthening and stretches as tolerated    PT Home Exercise Plan  Access Code: [email or web address]    Consulted and Agree with Plan of Care  Patient       Patient will benefit from skilled therapeutic intervention in order to improve the following deficits and impairments:  Abnormal gait, Decreased balance, Decreased endurance, Decreased mobility, Difficulty walking, Decreased range of motion, Impaired perceived functional ability, Improper body mechanics, Decreased activity tolerance, Decreased strength, Impaired flexibility, Postural dysfunction, Pain  Visit Diagnosis: Pain in left hip  Stiffness of right hip, not elsewhere classified  Stiffness of left hip, not  elsewhere classified  Other abnormalities of gait and mobility     Problem List There are no problems to display for this patient.   Juliann Pulse, PT, DPT  12/12/2019, 10:03  AM  [facility] [address] [place], [place], [address] Phone: [phone]   Fax:  [phone]  Name: Kelly Montoya MRN: [id number] Date of Birth: 02-Apr-1991

## 2019-12-12 NOTE — Patient Instructions (Signed)
Access Code: [email or web address] URL: [email or web address] Date: 12/12/2019 Prepared by: Rosanne Ashing  Exercises Prone Hip Extension - 2 x daily - 7 x weekly - 2 sets - 10 reps Prone Press Up - 4-5 x daily - 7 x weekly - 2 sets - 10 reps Supine Multifidus with Heel Press - 2 x daily - 7 x weekly - 2 sets - 10 reps - 3-5 seconds hold Pelvic Tilt on Swiss Ball - 2 x daily - 7 x weekly - 2 sets - 10 reps Seated March with Opposite Arm Flexion on Swiss Ball - 2 x daily - 7 x weekly - 2 sets - 10 reps Supine Active Straight Leg Raise - 2 x daily - 7 x weekly - 2 sets - 10 reps (added today, 12/12/19, - rest of program was issued at last visit)

## 2019-12-17 ENCOUNTER — Encounter: Payer: Self-pay | Admitting: Physical Therapy

## 2019-12-17 ENCOUNTER — Ambulatory Visit: Payer: BC Managed Care – PPO | Admitting: Physical Therapy

## 2019-12-17 ENCOUNTER — Other Ambulatory Visit: Payer: Self-pay

## 2019-12-17 DIAGNOSIS — M25552 Pain in left hip: Secondary | ICD-10-CM

## 2019-12-17 DIAGNOSIS — R2689 Other abnormalities of gait and mobility: Secondary | ICD-10-CM

## 2019-12-17 DIAGNOSIS — M5416 Radiculopathy, lumbar region: Secondary | ICD-10-CM

## 2019-12-17 DIAGNOSIS — M25652 Stiffness of left hip, not elsewhere classified: Secondary | ICD-10-CM

## 2019-12-17 DIAGNOSIS — M25651 Stiffness of right hip, not elsewhere classified: Secondary | ICD-10-CM

## 2019-12-17 DIAGNOSIS — M545 Low back pain, unspecified: Secondary | ICD-10-CM

## 2019-12-17 NOTE — Therapy (Signed)
[facility] [place], [place], [address] Phone: [phone]   Fax:  [phone]  Physical Therapy Treatment  Patient Details  Name: Kelly Montoya MRN: [id number] Date of Birth: 11-May-1991 Referring Provider (PT): Rachell Cipro MD   Encounter Date: 12/17/2019  PT End of Session - 12/17/19 1643    Visit Number  12    Number of Visits  17    Date for PT Re-Evaluation  01/06/20    Authorization Type  BCBS    PT Start Time  1500    PT Stop Time  1552   time spent for dry needling, estim and traction not included in diect tx. minutes   PT Time Calculation (min)  52 min    Activity Tolerance  Patient tolerated treatment well    Behavior During Therapy  [facility] for tasks assessed/performed       Past Medical History:  Diagnosis Date  . Allergy   . Anemia   . Breast cyst   . Family history of breast cancer    My Risk neg 2/17, NBN VUS  . Family history of ovarian cancer   . Increased risk of breast cancer    IBIS Lifetime risk 25.4%; MyRisk neg  . Neuromuscular disorder [facility])     Past Surgical History:  Procedure Laterality Date  . MANDIBLE SURGERY Bilateral 01/012011   nerve damage with numbness on the chin on the right side.    There were no vitals filed for this visit.  Subjective Assessment - 12/17/19 1539    Subjective  Pt. reports exacerbation of pain over the past several days predominately in right lateral lumbar and upper glut region. Minimal left-sided symptoms and no LE radiating pain. Possible association with work/graduation activities otherwise no overt mechanism of exacerbation noted. Pain 6/10 this PM.    Pertinent History  L4/5 and L5/S1 herniated disc    Currently in Pain?  Yes    Pain Score  6     Pain Location  Back    Pain Orientation  Right;Lower    Pain Descriptors / Indicators  Dull    Pain Type  Acute pain    Pain Onset  More than a month ago   exacerbation as noted in  subjective over the past several days   Pain Frequency  Constant    Aggravating Factors   activity/movement    Pain Relieving Factors  mild ease of back pain symptoms with trunk extension but pain has been constant                        [facility] Adult PT Treatment/Exercise - 12/17/19 0001      Traction   Type of Traction  Lumbar    Min (lbs)  50    Max (lbs)  90    Hold Time  60    Rest Time  20    Time  15      Manual Therapy   Manual Therapy  Soft tissue mobilization    Soft tissue mobilization  STM right lumbar region and proximal/lateral glut       Trigger Point Dry Needling - 12/17/19 0001    Consent Given?  Yes    Education Handout Provided  Yes    Muscles Treated Back/Hip  Gluteus maximus;Erector spinae;Lumbar multifidi;Quadratus lumborum    Dry Needling Comments  needling to right QL in left sidelying over pillow with 30 gauge 75 mm needle,  needling to bilateral longissimus and multifidi L4-5 region and right upper glut all with 32 gauge 50 mm needles    Electrical Stimulation Performed with Dry Needling  Yes    E-stim with Dry Needling Details  TENS 2 pps x 10 minutes           PT Education - 12/17/19 1643    Education Details  dry needling, POC    Person(s) Educated  Patient    Methods  Explanation    Comprehension  Verbalized understanding       PT Short Term Goals - 12/05/19 0934      PT SHORT TERM GOAL #1   Title  Patient will be independent with HEP to foster improved strength and reduced pain. 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    Baseline  MET 12/05/19: lumbar extension = 23 degrees    Time  4    Period  Weeks    Status  Achieved      PT SHORT TERM GOAL #3   Title  Patient will demonstrate >/= 40 degrees of bilateral hip rotation to decrease reliance on compensatory movements during ADLs to reduce injury risk.    Baseline  MET 12/05/19: L = 45 degrees, R = 46 degrees    Time  4    Period  Weeks    Status  Achieved      PT SHORT TERM GOAL #4   Title  Patient will report she is able to tolerate standing activities (such as meal prep) for >/= 15 minutes with use of exercise/positioning as needed to indicate improvement in ability to perform ADLs.    Baseline  MET 12/05/19: patient reports she is able to stand for long enough periods of time to get ready at bathroom sink and meal preps for > 15 minutes with no issues    Time  4    Period  Weeks    Status  Achieved        PT Long Term Goals - 11/07/19 1508      PT LONG TERM GOAL #1   Title  Patient will be independent with HEP to foster improved strength and reduced pain. (ALL LTGs DUE 01/06/20)    Time  8    Period  Weeks    Status  New    Target Date  01/06/20      PT LONG TERM GOAL #2   Title  Patient will demonstrate >/= 45 degrees of bilateral hip rotation to decrease reliance on compensatory movements during ADLs to reduce injury risk.    Time  8    Period  Weeks    Status  New      PT LONG TERM GOAL #3   Title  Patient's bilateral hip flexors, extensors, and abductors will be 5/5 to demonstrate improvement in functional mobility.    Time  8  Period  Weeks    Status  New      PT LONG TERM GOAL #4   Title  Patient will have >/= 20 degrees of lumbar extension in order to demonstrate a decreased reliance on compensatory movement strategies during ADLs.    Time  8    Period  Weeks    Status  New      PT LONG TERM GOAL #5   Title  Patient will report she is able to fully return  to all work related duties with ability to verbalize changes in mechanics/ergonomics as needed to modify her work day/environment to decrease risk of further injury.    Time  8    Period  Weeks    Status  New            Plan - 12/17/19 1644    Clinical Impression Statement  Some setback today with pain exacerbation as noted in subjective. Primary pain local to right lumbar region with associated muscle tightness/spasm so suspect myofascial etiology (to symptom exacerbation) with underlying discogenic issue. Continued mechanical traction but worked on manual for right lumbar region and also included trial dry needling today to address myofascial tension with good tolerance. Tx. well-tolerated but expect may take 1-2 days to note results so will await status at next therapy session.    Personal Factors and Comorbidities  Comorbidity 1    Comorbidities  L4/L5 and L5/S1 disc herniation    Examination-Activity Limitations  Sit;Sleep;Lift;Squat;Locomotion Level;Stairs;Carry;Stand    Examination-Participation Restrictions  Interpersonal Relationship;Yard Work;Cleaning;Laundry;Community Activity;Shop;Driving;Meal Prep    Stability/Clinical Decision Making  Stable/Uncomplicated    Clinical Decision Making  Low    Rehab Potential  Good    PT Frequency  2x / week    PT Duration  8 weeks    PT Treatment/Interventions  ADLs/Self Care Home Management;Aquatic Therapy;Biofeedback;Traction;Moist Heat;Electrical Stimulation;Cryotherapy;Ultrasound;DME Instruction;Gait training;Therapeutic exercise;Therapeutic activities;Functional mobility training;Stair training;Balance training;Neuromuscular re-education;Cognitive remediation;Patient/family education;Manual techniques;Passive range of motion;Energy conservation;Spinal Manipulations;Joint Manipulations;Dry needling    PT Next Visit Plan  Addended plan of care sent to referring provider to include dry needling-check response and will incorporate if needed in  future tx. sessoins though still plan focus directional exercises for extension bias, core strengthening and traction.    PT Home Exercise Plan  Access Code: [email or web address]    Consulted and Agree with Plan of Care  Patient       Patient will benefit from skilled therapeutic intervention in order to improve the following deficits and impairments:  Abnormal gait, Decreased balance, Decreased endurance, Decreased mobility, Difficulty walking, Decreased range of motion, Impaired perceived functional ability, Improper body mechanics, Decreased activity tolerance, Decreased strength, Impaired flexibility, Postural dysfunction, Pain  Visit Diagnosis: Pain in left hip  Stiffness of right hip, not elsewhere classified  Stiffness of left hip, not elsewhere classified  Other abnormalities of gait and mobility  Radiculopathy, lumbar region  Acute midline low back pain, unspecified whether sciatica present     Problem List There are no problems to display for this patient.   Beaulah Dinning, PT, DPT 12/17/19 4:50 PM  [facility] [address] [place], [place], [address] Phone: [phone]   Fax:  [phone]  Name: Kelly Montoya MRN: [id number] Date of Birth: December 15, 1990

## 2019-12-17 NOTE — Patient Instructions (Signed)

## 2019-12-19 ENCOUNTER — Encounter: Payer: Self-pay | Admitting: Rehabilitative and Restorative Service Providers"

## 2019-12-19 ENCOUNTER — Other Ambulatory Visit: Payer: Self-pay

## 2019-12-19 ENCOUNTER — Ambulatory Visit: Payer: BC Managed Care – PPO | Admitting: Rehabilitative and Restorative Service Providers"

## 2019-12-19 DIAGNOSIS — M25552 Pain in left hip: Secondary | ICD-10-CM | POA: Diagnosis not present

## 2019-12-19 DIAGNOSIS — M25652 Stiffness of left hip, not elsewhere classified: Secondary | ICD-10-CM

## 2019-12-19 DIAGNOSIS — M25651 Stiffness of right hip, not elsewhere classified: Secondary | ICD-10-CM

## 2019-12-19 DIAGNOSIS — M5416 Radiculopathy, lumbar region: Secondary | ICD-10-CM

## 2019-12-19 NOTE — Therapy (Signed)
[facility] [address] [place], [place], [address] Phone: [phone]   Fax:  [phone]  Physical Therapy Treatment  Patient Details  Name: Kelly Montoya MRN: [id number] Date of Birth: 09/08/1990 Referring Provider (PT): Rachell Cipro MD   Encounter Date: 12/19/2019   PT End of Session - 12/19/19 0850    Visit Number 13    Number of Visits 17    Date for PT Re-Evaluation 01/06/20    Authorization Type BCBS    PT Start Time 0801    PT Stop Time 0844    PT Time Calculation (min) 43 min    Activity Tolerance Patient tolerated treatment well    Behavior During Therapy [facility] for tasks assessed/performed           Past Medical History:  Diagnosis Date  . Allergy   . Anemia   . Breast cyst   . Family history of breast cancer    My Risk neg 2/17, NBN VUS  . Family history of ovarian cancer   . Increased risk of breast cancer    IBIS Lifetime risk 25.4%; MyRisk neg  . Neuromuscular disorder [facility])     Past Surgical History:  Procedure Laterality Date  . MANDIBLE SURGERY Bilateral 01/012011   nerve damage with numbness on the chin on the right side.    There were no vitals filed for this visit.   Subjective Assessment - 12/19/19 0802    Subjective Reports benefit from dry needling and traction used at [facility] at her last PT visit. 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 was having to stand for a prolonged period of time. Then when waking on Wednesday, 12/17/19, she was very painful. Had PT at [facility] that day.    Pertinent History L4/5 and L5/S1 herniated disc    Currently in Pain? Yes    Pain Location Back    Pain Orientation Right;Lower    Pain Descriptors / Indicators Dull    Pain Type Acute pain    Pain Onset More than a month ago   exacerbation as noted in subjective over the past several days                             [facility] Adult PT Treatment/Exercise - 12/19/19 0001      Exercises   Exercises Lumbar;Knee/Hip      Lumbar Exercises: Stretches   Other Lumbar Stretch Exercise SUPINE EXERCISE (no space under supine exercises to list it): bilateral arm pull down/lat pull down with yellow theraband (PT acting as anchor above head) + unilateral leg lift with cueing to maintain strong TrA brace throughout motion and for breath between each repetition      Lumbar Exercises: Seated   Other Seated Lumbar Exercises pelvic tilt on physioball x 15 - cueing for proper bracing wiht posterior portion of tilt    Other Seated Lumbar Exercises B shoulder external rotation against yellow theraband loop + scapular setting while maintaining TrA bracing on physioball x 10, 2 sets - benefitted from tactile cueing fostering proper scapular setting       Lumbar Exercises: Supine   Pelvic Tilt 10 reps   3 seconds, 2 sets    Pelvic Tilt Limitations cues to maintain brace throughout entire tilt     Other Supine Lumbar Exercises Lower trunk rotation with BLEs on small physioball x  10, 2 sets bilaterally - cueing to maintain brace throughout LE motion     Other Supine Lumbar Exercises B arm pull down + yellow theraband (PT as anchor above patient's head) x 10, 2 sets - second set with 5 second isometric hold - cueing to maintain brace and slight pelvic tilt throughout upper extremity motion      Knee/Hip Exercises: Stretches   Passive Hamstring Stretch Right;Left;3 reps;20 seconds   seated edge of mat table    Passive Hamstring Stretch Limitations cueing to maintain proper lumbar and pelvic position to maximize stretch                    PT Short Term Goals - 12/05/19 0934      PT SHORT TERM GOAL #1   Title Patient will be independent with HEP to foster improved strength and reduced pain. 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    Baseline MET 12/05/19: lumbar extension = 23 degrees    Time 4    Period Weeks    Status Achieved      PT SHORT TERM GOAL #3   Title Patient will demonstrate >/= 40 degrees of bilateral hip rotation to decrease reliance on compensatory movements during ADLs to reduce injury risk.    Baseline MET 12/05/19: L = 45 degrees, R = 46 degrees    Time 4    Period Weeks    Status Achieved      PT SHORT TERM GOAL #4   Title Patient will report she is able to tolerate standing activities (such as meal prep) for >/= 15 minutes with use of exercise/positioning as needed to indicate improvement in ability to perform ADLs.    Baseline MET 12/05/19: patient reports she is able to stand for long enough periods of time to get ready at bathroom sink and meal preps for > 15 minutes with no issues    Time 4    Period Weeks    Status Achieved             PT Long Term Goals - 11/07/19 1508      PT LONG TERM GOAL #1   Title Patient will be independent with HEP to foster improved strength and reduced pain. (ALL LTGs DUE 01/06/20)    Time 8    Period Weeks    Status New    Target Date 01/06/20      PT LONG TERM GOAL #2   Title Patient will demonstrate >/= 45 degrees of bilateral hip rotation to decrease reliance on compensatory movements during ADLs to reduce injury risk.    Time 8    Period Weeks    Status New      PT LONG TERM GOAL #3   Title Patient's bilateral hip flexors, extensors, and abductors will be 5/5 to demonstrate improvement in functional mobility.    Time 8    Period Weeks  Status New      PT LONG TERM GOAL #4   Title Patient will have >/= 20 degrees of lumbar extension in order to demonstrate a decreased reliance on compensatory movement strategies during ADLs.    Time 8    Period Weeks    Status New      PT LONG TERM GOAL #5   Title Patient will report she is able to fully return to all work related duties with ability to verbalize changes in mechanics/ergonomics as needed to modify her work day/environment to decrease risk of further injury.    Time 8    Period Weeks    Status New                 Plan - 12/19/19 0853    Clinical Impression Statement Today's skilled session focused on progression of core stabilization in primarily supported positions initially given the setback that she experienced this past week. Of note, when treated at [facility], dry needling was added to POC and tried for first time with patient presenting today reporting great benefit. She tolerated today's treatment very well including progression to include upper extremity motion in conjunction with lower extremity while maintaining brace of core stabilizers signifying improvement in both strength and endurance of these muscles. She appears to be greatly improving from setback (with an unknown cause) from earlier in the week and will benefit from continued skilled PT working towards [place].    Personal Factors and Comorbidities Comorbidity 1    Comorbidities L4/L5 and L5/S1 disc herniation    Examination-Activity Limitations Sit;Sleep;Lift;Squat;Locomotion Level;Stairs;Carry;Stand    Examination-Participation Restrictions Interpersonal Relationship;Yard Work;Cleaning;Laundry;Community Activity;Shop;Driving;Meal Prep    Stability/Clinical Decision Making Stable/Uncomplicated    Rehab Potential Good    PT Frequency 2x / week    PT Duration 8 weeks    PT Treatment/Interventions ADLs/Self Care Home Management;Aquatic Therapy;Biofeedback;Traction;Moist Heat;Electrical  Stimulation;Cryotherapy;Ultrasound;DME Instruction;Gait training;Therapeutic exercise;Therapeutic activities;Functional mobility training;Stair training;Balance training;Neuromuscular re-education;Cognitive remediation;Patient/family education;Manual techniques;Passive range of motion;Energy conservation;Spinal Manipulations;Joint Manipulations;Dry needling    PT Next Visit Plan continue with traction and dry needling in future [employer] due to positive response to initial trial; sessoins though still plan focus directional exercises for extension bias, core strengthening and traction.    PT Home Exercise Plan Access Code: [email or web address]    Consulted and Agree with Plan of Care Patient           Patient will benefit from skilled therapeutic intervention in order to improve the following deficits and impairments:  Abnormal gait, Decreased balance, Decreased endurance, Decreased mobility, Difficulty walking, Decreased range of motion, Impaired perceived functional ability, Improper body mechanics, Decreased activity tolerance, Decreased strength, Impaired flexibility, Postural dysfunction, Pain  Visit Diagnosis: Pain in left hip  Stiffness of right hip, not elsewhere classified  Stiffness of left hip, not elsewhere classified  Radiculopathy, lumbar region     Problem List There are no problems to display for this patient.   Juliann Pulse, PT, DPT  12/19/2019, 8:54 AM  [facility] [address] [place], [place], [address] Phone: [phone]   Fax:  [phone]  Name: ELANY FELIX MRN: [id number] Date of Birth: 1991/02/28

## 2019-12-23 ENCOUNTER — Ambulatory Visit: Payer: BC Managed Care – PPO | Admitting: Physical Therapy

## 2019-12-24 ENCOUNTER — Ambulatory Visit: Payer: BC Managed Care – PPO | Admitting: Physical Therapy

## 2019-12-24 ENCOUNTER — Other Ambulatory Visit: Payer: Self-pay

## 2019-12-24 ENCOUNTER — Encounter: Payer: Self-pay | Admitting: Physical Therapy

## 2019-12-24 DIAGNOSIS — M545 Low back pain, unspecified: Secondary | ICD-10-CM

## 2019-12-24 DIAGNOSIS — M25552 Pain in left hip: Secondary | ICD-10-CM

## 2019-12-24 DIAGNOSIS — M25652 Stiffness of left hip, not elsewhere classified: Secondary | ICD-10-CM

## 2019-12-24 DIAGNOSIS — R2689 Other abnormalities of gait and mobility: Secondary | ICD-10-CM

## 2019-12-24 DIAGNOSIS — M5416 Radiculopathy, lumbar region: Secondary | ICD-10-CM

## 2019-12-24 DIAGNOSIS — M25651 Stiffness of right hip, not elsewhere classified: Secondary | ICD-10-CM

## 2019-12-24 NOTE — Therapy (Signed)
[facility] [place], [place], [address] Phone: [phone]   Fax:  [phone]  Physical Therapy Treatment  Patient Details  Name: Kelly Montoya MRN: [id number] Date of Birth: [birth year] Referring Provider (PT): Rachell Cipro MD   Encounter Date: 12/24/2019   PT End of Session - 12/24/19 0932    Visit Number 14    Number of Visits 17    Date for PT Re-Evaluation 01/06/20    Authorization Type BCBS    PT Start Time 0927    PT Stop Time 1016    PT Time Calculation (min) 49 min    Activity Tolerance Patient tolerated treatment well    Behavior During Therapy [facility] for tasks assessed/performed           Past Medical History:  Diagnosis Date  . Allergy   . Anemia   . Breast cyst   . Family history of breast cancer    My Risk neg 2/17, NBN VUS  . Family history of ovarian cancer   . Increased risk of breast cancer    IBIS Lifetime risk 25.4%; MyRisk neg  . Neuromuscular disorder [facility])     Past Surgical History:  Procedure Laterality Date  . MANDIBLE SURGERY Bilateral 01/012011   nerve damage with numbness on the chin on the right side.    There were no vitals filed for this visit.   Subjective Assessment - 12/24/19 0932    Subjective Pt. reports as noted last session that dry needling last week was helpful for local lumbar pain. She reports over the weekend had some intermittent left>right lumbar pain/muscular symptoms with some intermittent radiating pain into left hamstring and calf. No pain pre-tx.    Currently in Pain? No/denies                             [facility] Adult PT Treatment/Exercise - 12/24/19 0001      Lumbar Exercises: Stretches   Prone on Elbows Stretch Limitations prone on elbows with left leg in "figure 4" position x 2 min    Press Ups Limitations prone press up with left leg in "figure 4" position 2x10    Other Lumbar Stretch Exercise supine left dural nerve  floss x10, reviewed seated variation as well for incorporating neck flexion/extension with left LE ROM      Traction   Type of Traction Lumbar    Min (lbs) 50    Max (lbs) 95    Hold Time 60    Rest Time 20    Time 15            Trigger Point Dry Needling - 12/24/19 0001    Consent Given? Yes    Education Handout Provided Previously provided    Muscles Treated Back/Hip Erector spinae;Lumbar multifidi;Quadratus lumborum    Dry Needling Comments needling in prone with 32 gauge 50 mm needles, included bilat.L4-5 level multifidi and longissimus, right RL and iliocostalis    Electrical Stimulation Performed with Dry Needling Yes    E-stim with Dry Needling Details TENS 2 pps x 10 minutes                PT Education - 12/24/19 0940    Education Details prone press up modification, POC    Person(s) Educated Patient    Methods Explanation;Demonstration;Verbal cues    Comprehension Verbalized understanding;Returned demonstration  PT Short Term Goals - 12/05/19 0934      PT SHORT TERM GOAL #1   Title Patient will be independent with HEP to foster improved strength and reduced pain. 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    Baseline MET 12/05/19: lumbar extension = 23 degrees    Time 4    Period Weeks    Status Achieved      PT SHORT TERM GOAL #3   Title Patient will demonstrate >/= 40 degrees of bilateral hip rotation to decrease reliance on compensatory movements during ADLs to reduce injury risk.     Baseline MET 12/05/19: L = 45 degrees, R = 46 degrees    Time 4    Period Weeks    Status Achieved      PT SHORT TERM GOAL #4   Title Patient will report she is able to tolerate standing activities (such as meal prep) for >/= 15 minutes with use of exercise/positioning as needed to indicate improvement in ability to perform ADLs.    Baseline MET 12/05/19: patient reports she is able to stand for long enough periods of time to get ready at bathroom sink and meal preps for > 15 minutes with no issues    Time 4    Period Weeks    Status Achieved             PT Long Term Goals - 11/07/19 1508      PT LONG TERM GOAL #1   Title Patient will be independent with HEP to foster improved strength and reduced pain. (ALL LTGs DUE 01/06/20)    Time 8    Period Weeks    Status New    Target Date 01/06/20      PT LONG TERM GOAL #2   Title Patient will demonstrate >/= 45 degrees of bilateral hip rotation to decrease reliance on compensatory movements during ADLs to reduce injury risk.    Time 8    Period Weeks    Status New      PT LONG TERM GOAL #3   Title Patient's bilateral hip flexors, extensors, and abductors will be 5/5 to demonstrate improvement in functional mobility.    Time 8    Period Weeks    Status New      PT LONG TERM GOAL #4   Title Patient will have >/= 20 degrees of lumbar extension in order to demonstrate a decreased reliance on compensatory movement strategies during ADLs.    Time 8    Period Weeks    Status New      PT LONG TERM GOAL #5   Title Patient will report she is able to fully return to all work related duties with ability to verbalize changes in mechanics/ergonomics as needed to modify her work day/environment to decrease risk of further injury.    Time 8    Period Weeks    Status New  Plan - 12/24/19 0936    Clinical Impression Statement Still with some intermittent local lumbar pain and left>right LE radicular symptoms but overall  pt. continues to gradually improve with decreased pain and improving activity tolerance. Tried modified prone press up today with left leg in "figure 4 position" to see if can help further address radicular symptoms so will await further response with this. Continued benefit from traction noted and also continued dry needling given benefit noted last session. Progress for LTGs ongoing.    Personal Factors and Comorbidities Comorbidity 1    Comorbidities L4/L5 and L5/S1 disc herniation    Examination-Activity Limitations Sit;Sleep;Lift;Squat;Locomotion Level;Stairs;Carry;Stand    Examination-Participation Restrictions Interpersonal Relationship;Yard Work;Cleaning;Laundry;Community Activity;Shop;Driving;Meal Prep    Stability/Clinical Decision Making Stable/Uncomplicated    Clinical Decision Making Low    Rehab Potential Good    PT Frequency 2x / week    PT Duration 8 weeks    PT Treatment/Interventions ADLs/Self Care Home Management;Aquatic Therapy;Biofeedback;Traction;Moist Heat;Electrical Stimulation;Cryotherapy;Ultrasound;DME Instruction;Gait training;Therapeutic exercise;Therapeutic activities;Functional mobility training;Stair training;Balance training;Neuromuscular re-education;Cognitive remediation;Patient/family education;Manual techniques;Passive range of motion;Energy conservation;Spinal Manipulations;Joint Manipulations;Dry needling    PT Next Visit Plan continue with traction and dry needling in [employer] due to positive response to initial trial; sessions though still plan focus directional exercises for extension bias, core strengthening and traction.    PT Home Exercise Plan Access Code: [email or web address]    Consulted and Agree with Plan of Care Patient           Patient will benefit from skilled therapeutic intervention in order to improve the following deficits and impairments:  Abnormal gait, Decreased balance, Decreased endurance, Decreased mobility, Difficulty walking,  Decreased range of motion, Impaired perceived functional ability, Improper body mechanics, Decreased activity tolerance, Decreased strength, Impaired flexibility, Postural dysfunction, Pain  Visit Diagnosis: Pain in left hip  Radiculopathy, lumbar region  Stiffness of left hip, not elsewhere classified  Stiffness of right hip, not elsewhere classified  Acute midline low back pain, unspecified whether sciatica present  Other abnormalities of gait and mobility     Problem List There are no problems to display for this patient.   Beaulah Dinning, PT, DPT 12/24/19 10:11 AM  [facility] [address] [place], [place], [address] Phone: [phone]   Fax:  [phone]  Name: CHANCI OJALA MRN: [id number] Date of Birth: 1991-01-28

## 2019-12-26 ENCOUNTER — Other Ambulatory Visit: Payer: Self-pay

## 2019-12-26 ENCOUNTER — Encounter: Payer: Self-pay | Admitting: Physical Therapy

## 2019-12-26 ENCOUNTER — Ambulatory Visit: Payer: BC Managed Care – PPO | Admitting: Physical Therapy

## 2019-12-26 DIAGNOSIS — M25652 Stiffness of left hip, not elsewhere classified: Secondary | ICD-10-CM

## 2019-12-26 DIAGNOSIS — M25552 Pain in left hip: Secondary | ICD-10-CM | POA: Diagnosis not present

## 2019-12-26 NOTE — Therapy (Signed)
[facility] [address] [place], [place], [address] Phone: [phone]   Fax:  [phone]  Physical Therapy Treatment  Patient Details  Name: Kelly Montoya MRN: [id number] Date of Birth: 1991-05-09 Referring Provider (PT): Rachell Cipro MD   Encounter Date: 12/26/2019   PT End of Session - 12/26/19 0847    Visit Number 15    Number of Visits 17    Date for PT Re-Evaluation 01/06/20    Authorization Type BCBS    PT Start Time 0803    PT Stop Time 0843    PT Time Calculation (min) 40 min    Activity Tolerance Patient tolerated treatment well    Behavior During Therapy [facility] for tasks assessed/performed           Past Medical History:  Diagnosis Date   Allergy    Anemia    Breast cyst    Family history of breast cancer    My Risk neg 2/17, NBN VUS   Family history of ovarian cancer    Increased risk of breast cancer    IBIS Lifetime risk 25.4%; MyRisk neg   Neuromuscular disorder [facility])     Past Surgical History:  Procedure Laterality Date   MANDIBLE SURGERY Bilateral 01/012011   nerve damage with numbness on the chin on the right side.    There were no vitals filed for this visit.   Subjective Assessment - 12/26/19 0805    Subjective Dry needling is going really well - eased up the pain that she had after last session. Today pain is just more localized to her L buttocks/hip region. Exercises are going well - pelvic tilts on the ball are the most helpful.    Currently in Pain? Yes    Pain Score 2     Pain Location Hip    Pain Orientation Left    Pain Descriptors / Indicators Dull    Aggravating Factors  activity - makes it come and go    Pain Relieving Factors pelvic tilts/doing her exercises                         Therapeutic Exercise:   -Standing lumbar extension against counter: 2 x 10 reps -Seated figure 4 piriformis stretch on L 2 x 30 seconds, on R 2 x 30  seconds  -Prone on elbows with holds for 2 x 30 seconds -Prone with elbows extended x10 reps - initial cues for proper technique,  -prone press up with LLE in "figure 4" position, reviewed from recent addition from HEP at [address], cues to perform on elbows x10 reps -hip extension strengthening in prone with knee bent first x5 reps B, x10 reps B  -childs pose x15 seconds then performing with reaching BUE to R and then L x30 second holds each, x2 reps -in quadruped position, cues for proper shoulder alignment, cues for TA activation, performing first with scapular protraction x5 reps, and then progressed x10 reps with core activation lifting knees off ground for 1-2 second holds  -bridging with yoga block cues for ADD squeeze x10 reps and holding at full ROM for 3 seconds, bridging with hip ABD into red band with 3 second holds x10 reps -seated on blue 75" physioball with B feet on blue balance discs x10 reps marching - cues for TA activation and tall posture, x10 reps alternating arm and leg lifts, with heel digs into blue balance discs for isometric hamstring  activation x10 reps anterior/posterior pelvic tilts.           PT Education - 12/26/19 0846    Education Details reviewed prone press up modification    Person(s) Educated Patient    Methods Explanation;Demonstration;Verbal cues    Comprehension Verbalized understanding;Returned demonstration            PT Short Term Goals - 12/05/19 0934      PT SHORT TERM GOAL #1   Title Patient will be independent with HEP to foster improved strength and reduced pain. 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    Baseline MET 12/05/19: lumbar extension = 23 degrees    Time 4    Period Weeks    Status Achieved      PT SHORT TERM GOAL #3   Title Patient will demonstrate >/= 40 degrees of bilateral hip rotation to decrease reliance on compensatory movements during ADLs to reduce injury risk.    Baseline MET 12/05/19: L = 45 degrees, R = 46 degrees    Time 4    Period Weeks    Status Achieved      PT SHORT TERM GOAL #4   Title Patient will report she is able to tolerate standing activities (such as meal prep) for >/= 15 minutes with use of exercise/positioning as needed to indicate improvement in ability to perform ADLs.    Baseline MET 12/05/19: patient reports she is able to stand for long enough periods of time to get ready at bathroom sink and meal preps for > 15 minutes with no issues    Time 4    Period Weeks    Status Achieved             PT Long Term Goals - 11/07/19 1508      PT LONG TERM GOAL #1   Title Patient will be independent with HEP to foster improved strength and reduced pain. (ALL LTGs DUE 01/06/20)    Time 8    Period Weeks    Status New    Target Date 01/06/20      PT LONG TERM GOAL #2   Title Patient will demonstrate >/= 45 degrees of bilateral hip rotation to decrease reliance on compensatory movements during ADLs to reduce injury risk.    Time 8    Period Weeks    Status New      PT LONG TERM GOAL #3   Title Patient's bilateral hip flexors, extensors, and abductors will be 5/5 to demonstrate improvement in functional mobility.    Time 8    Period Weeks    Status New      PT LONG TERM GOAL #4   Title Patient will have >/= 20 degrees of lumbar extension in order to demonstrate a decreased reliance on compensatory movement strategies during ADLs.    Time 8    Period Weeks    Status New  PT LONG TERM GOAL #5   Title  Patient will report she is able to fully return to all work related duties with ability to verbalize changes in mechanics/ergonomics as needed to modify her work day/environment to decrease risk of further injury.    Time 8    Period Weeks    Status New                 Plan - 12/26/19 8563    Clinical Impression Statement Todays skilled session focused on progression on core stabilization and exercises with lumbar extension in standing and prone position for extension bias. Reviewed modified prone press up with LLE in figure 4 position, with pt able to tolerate well, needing cues to perform on elbows as pt had tried performing in this position with elbows extended at home. Pt reporting great benefit from dry needling when being treated at [facility] location. Pt overall reporting a decrease in pain/stiffness at end of symptoms. Will continue to progress towards LTGs.    Personal Factors and Comorbidities Comorbidity 1    Comorbidities L4/L5 and L5/S1 disc herniation    Examination-Activity Limitations Sit;Sleep;Lift;Squat;Locomotion Level;Stairs;Carry;Stand    Examination-Participation Restrictions Interpersonal Relationship;Yard Work;Cleaning;Laundry;Community Activity;Shop;Driving;Meal Prep    Stability/Clinical Decision Making Stable/Uncomplicated    Rehab Potential Good    PT Frequency 2x / week    PT Duration 8 weeks    PT Treatment/Interventions ADLs/Self Care Home Management;Aquatic Therapy;Biofeedback;Traction;Moist Heat;Electrical Stimulation;Cryotherapy;Ultrasound;DME Instruction;Gait training;Therapeutic exercise;Therapeutic activities;Functional mobility training;Stair training;Balance training;Neuromuscular re-education;Cognitive remediation;Patient/family education;Manual techniques;Passive range of motion;Energy conservation;Spinal Manipulations;Joint Manipulations;Dry needling    PT Next Visit Plan continue with traction and dry needling in [employer] due to  positive response to initial trial; sessions though still plan focus directional exercises for extension bias, core strengthening and traction.    PT Home Exercise Plan Access Code: [email or web address]    Consulted and Agree with Plan of Care Patient           Patient will benefit from skilled therapeutic intervention in order to improve the following deficits and impairments:  Abnormal gait, Decreased balance, Decreased endurance, Decreased mobility, Difficulty walking, Decreased range of motion, Impaired perceived functional ability, Improper body mechanics, Decreased activity tolerance, Decreased strength, Impaired flexibility, Postural dysfunction, Pain  Visit Diagnosis: Pain in left hip  Stiffness of left hip, not elsewhere classified     Problem List There are no problems to display for this patient.   Arliss Journey, PT, DPT  12/26/2019, 8:51 AM  [facility] [address] [place], [place], [address] Phone: [phone]   Fax:  [phone]  Name: Kelly Montoya MRN: [id number] Date of Birth: [birth year]

## 2019-12-30 ENCOUNTER — Ambulatory Visit: Payer: BC Managed Care – PPO | Admitting: Physical Therapy

## 2019-12-31 ENCOUNTER — Ambulatory Visit: Payer: BC Managed Care – PPO | Admitting: Physical Therapy

## 2019-12-31 ENCOUNTER — Encounter: Payer: Self-pay | Admitting: Physical Therapy

## 2019-12-31 ENCOUNTER — Other Ambulatory Visit: Payer: Self-pay

## 2019-12-31 DIAGNOSIS — R2689 Other abnormalities of gait and mobility: Secondary | ICD-10-CM

## 2019-12-31 DIAGNOSIS — M25651 Stiffness of right hip, not elsewhere classified: Secondary | ICD-10-CM

## 2019-12-31 DIAGNOSIS — M5416 Radiculopathy, lumbar region: Secondary | ICD-10-CM

## 2019-12-31 DIAGNOSIS — M25552 Pain in left hip: Secondary | ICD-10-CM

## 2019-12-31 DIAGNOSIS — M545 Low back pain, unspecified: Secondary | ICD-10-CM

## 2019-12-31 DIAGNOSIS — M25652 Stiffness of left hip, not elsewhere classified: Secondary | ICD-10-CM

## 2019-12-31 NOTE — Therapy (Signed)
Kelly Montoya, [place], [address] Phone: [phone]   Fax:  [phone]  Physical Therapy Treatment  Patient Details  Name: Kelly Montoya MRN: [id number] Date of Birth: [birth year] Referring Provider (PT): Rachell Cipro MD   Encounter Date: 12/31/2019   PT End of Session - 12/31/19 1103    Visit Number 16    Number of Visits 17    Date for PT Re-Evaluation 01/06/20    Authorization Type BCBS    PT Start Time 1016    PT Stop Time 1106    PT Time Calculation (min) 50 min    Activity Tolerance Patient tolerated treatment well    Behavior During Therapy [facility] for tasks assessed/performed           Past Medical History:  Diagnosis Date  . Allergy   . Anemia   . Breast cyst   . Family history of breast cancer    My Risk neg 2/17, NBN VUS  . Family history of ovarian cancer   . Increased risk of breast cancer    IBIS Lifetime risk 25.4%; MyRisk neg  . Neuromuscular disorder [facility])     Past Surgical History:  Procedure Laterality Date  . MANDIBLE SURGERY Bilateral 01/012011   nerve damage with numbness on the chin on the right side.    There were no vitals filed for this visit.   Subjective Assessment - 12/31/19 1029    Subjective Overall still doing better-pain seems to migrate to different locations around lower back, SI region and left hip. Today pain mostly local to sacral region.    Currently in Pain? Yes    Pain Score 3     Pain Location Sacrum    Pain Descriptors / Indicators Sharp    Pain Type Acute pain    Pain Onset More than a month ago    Aggravating Factors  sitting down              [facility] PT Assessment - 12/31/19 0001      Palpation   SI assessment  left anterior innominate rotation noted                         [facility] Adult PT Treatment/Exercise - 12/31/19 0001      Traction   Type of Traction Lumbar    Min (lbs) 50    Max (lbs) 95    Hold Time 60     Rest Time 20    Time 16      Manual Therapy   Manual Therapy Muscle Energy Technique    Joint Mobilization LAD bilat. hips grade I-III    Muscle Energy Technique "shotgun" 5 x 5 sec, left hamstring and right hip flexor isometrics 2 sets of 5 for 5 sec holds            Trigger Point Dry Needling - 12/31/19 0001    Consent Given? Yes    Education Handout Provided Previously provided    Muscles Treated Back/Hip Erector spinae;Lumbar multifidi    Dry Needling Comments Lumbosacral multifidi and L4-5 region erector spinae needled in prone with 32 gauge 50 mm needles    Electrical Stimulation Performed with Dry Needling Yes    E-stim with Dry Needling Details TENS 2 pps x 10 minutes                PT Education - 12/31/19 1103    Education  Details SI anatomy/innominate rotation    Person(s) Educated Patient    Methods Explanation    Comprehension Verbalized understanding            PT Short Term Goals - 12/05/19 0934      PT SHORT TERM GOAL #1   Title Patient will be independent with HEP to foster improved strength and reduced pain. 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    Baseline MET 12/05/19: lumbar extension = 23 degrees    Time 4    Period Weeks    Status Achieved      PT SHORT TERM GOAL #3   Title Patient will demonstrate >/= 40 degrees of bilateral hip rotation to decrease reliance on compensatory movements during ADLs to reduce injury risk.    Baseline MET 12/05/19: L = 45 degrees, R = 46 degrees     Time 4    Period Weeks    Status Achieved      PT SHORT TERM GOAL #4   Title Patient will report she is able to tolerate standing activities (such as meal prep) for >/= 15 minutes with use of exercise/positioning as needed to indicate improvement in ability to perform ADLs.    Baseline MET 12/05/19: patient reports she is able to stand for long enough periods of time to get ready at bathroom sink and meal preps for > 15 minutes with no issues    Time 4    Period Weeks    Status Achieved             PT Long Term Goals - 11/07/19 1508      PT LONG TERM GOAL #1   Title Patient will be independent with HEP to foster improved strength and reduced pain. (ALL LTGs DUE 01/06/20)    Time 8    Period Weeks    Status New    Target Date 01/06/20      PT LONG TERM GOAL #2   Title Patient will demonstrate >/= 45 degrees of bilateral hip rotation to decrease reliance on compensatory movements during ADLs to reduce injury risk.    Time 8    Period Weeks    Status New      PT LONG TERM GOAL #3   Title Patient's bilateral hip flexors, extensors, and abductors will be 5/5 to demonstrate improvement in functional mobility.    Time 8    Period Weeks    Status New      PT LONG TERM GOAL #4   Title Patient will have >/= 20 degrees of lumbar extension in order to demonstrate a decreased reliance on compensatory movement strategies during ADLs.    Time 8    Period Weeks    Status New      PT LONG TERM GOAL #5   Title Patient will report she is able to fully return to all work related duties with ability to verbalize changes in  mechanics/ergonomics as needed to modify her work day/environment to decrease risk of further injury.    Time 8    Period Weeks    Status New                 Plan - 12/31/19 1103    Clinical Impression Statement Assessed SI given pain report as noted in subjective with left anterior innominate rotation noted-able to correct with METs as noted per flowsheet.  Otherwise continued traction and dry needling given previous benefit noted. Pt. still overall progressing re: therapy goals-see plan below.    Personal Factors and Comorbidities Comorbidity 1    Comorbidities L4/L5 and L5/S1 disc herniation    Examination-Activity Limitations Sit;Sleep;Lift;Squat;Locomotion Level;Stairs;Carry;Stand    Examination-Participation Restrictions Interpersonal Relationship;Yard Work;Cleaning;Laundry;Community Activity;Shop;Driving;Meal Prep    Stability/Clinical Decision Making Stable/Uncomplicated    Clinical Decision Making Low    Rehab Potential Good    PT Frequency 2x / week    PT Duration 8 weeks    PT Treatment/Interventions ADLs/Self Care Home Management;Aquatic Therapy;Biofeedback;Traction;Moist Heat;Electrical Stimulation;Cryotherapy;Ultrasound;DME Instruction;Gait training;Therapeutic exercise;Therapeutic activities;Functional mobility training;Stair training;Balance training;Neuromuscular re-education;Cognitive remediation;Patient/family education;Manual techniques;Passive range of motion;Energy conservation;Spinal Manipulations;Joint Manipulations;Dry needling    PT Next Visit Plan Next visit at [facility] location for treatment then follow up at [facility]. next week for ERO visit to determine d/c vs. recertification to continue pending status and need for further skilled PT    PT Home Exercise Plan Access Code: [email or web address]    Consulted and Agree with Plan of Care Patient           Patient will benefit from skilled therapeutic intervention in order to improve the following deficits and impairments:  Abnormal gait, Decreased balance, Decreased endurance, Decreased mobility, Difficulty walking, Decreased range of motion, Impaired perceived functional ability, Improper body mechanics, Decreased activity tolerance, Decreased strength, Impaired flexibility, Postural dysfunction, Pain  Visit Diagnosis: Pain in left hip  Stiffness of left hip, not elsewhere  classified  Radiculopathy, lumbar region  Stiffness of right hip, not elsewhere classified  Acute midline low back pain, unspecified whether sciatica present  Other abnormalities of gait and mobility     Problem List There are no problems to display for this patient.   Beaulah Dinning, PT, DPT 12/31/19 11:13 AM  [facility] [address] [place], [place], [address] Phone: [phone]   Fax:  [phone]  Name: VELVIE THOMASTON MRN: [id number] Date of Birth: Jun 02, 1991

## 2020-01-02 ENCOUNTER — Ambulatory Visit: Payer: BC Managed Care – PPO | Admitting: Physical Therapy

## 2020-01-02 ENCOUNTER — Encounter: Payer: Self-pay | Admitting: Physical Therapy

## 2020-01-02 ENCOUNTER — Other Ambulatory Visit: Payer: Self-pay

## 2020-01-02 DIAGNOSIS — M25651 Stiffness of right hip, not elsewhere classified: Secondary | ICD-10-CM

## 2020-01-02 DIAGNOSIS — M25552 Pain in left hip: Secondary | ICD-10-CM

## 2020-01-02 DIAGNOSIS — M545 Low back pain, unspecified: Secondary | ICD-10-CM

## 2020-01-02 DIAGNOSIS — M25652 Stiffness of left hip, not elsewhere classified: Secondary | ICD-10-CM

## 2020-01-02 DIAGNOSIS — R2689 Other abnormalities of gait and mobility: Secondary | ICD-10-CM

## 2020-01-02 DIAGNOSIS — M5416 Radiculopathy, lumbar region: Secondary | ICD-10-CM

## 2020-01-02 NOTE — Therapy (Signed)
[facility] [address] [place], [place], [address] Phone: [phone]   Fax:  [phone]  Physical Therapy Treatment  Patient Details  Name: Kelly Montoya MRN: [id number] Date of Birth: May 08, 1991 Referring Provider (PT): Rachell Cipro MD   Encounter Date: 01/02/2020   PT End of Session - 01/02/20 0722    Visit Number 17    Number of Visits 17    Date for PT Re-Evaluation 01/06/20    Authorization Type BCBS    PT Start Time 0717    PT Stop Time 0758    PT Time Calculation (min) 41 min    Activity Tolerance Patient tolerated treatment well;No increased pain    Behavior During Therapy WFL for tasks assessed/performed           Past Medical History:  Diagnosis Date  . Allergy   . Anemia   . Breast cyst   . Family history of breast cancer    My Risk neg 2/17, NBN VUS  . Family history of ovarian cancer   . Increased risk of breast cancer    IBIS Lifetime risk 25.4%; MyRisk neg  . Neuromuscular disorder [facility])     Past Surgical History:  Procedure Laterality Date  . MANDIBLE SURGERY Bilateral 01/012011   nerve damage with numbness on the chin on the right side.    There were no vitals filed for this visit.   Subjective Assessment - 01/02/20 0720    Subjective No new complaints. 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 Biking and walking more now with stretching as needed. Has bought a ball as well to use at home with stretches.    Pertinent History L4/5 and L5/S1 herniated disc    Limitations House hold activities;Lifting;Standing;Walking    How long can you sit comfortably? I try to sit reclined to make myself comfortable - when sitting in this position, I can be here for a long while    How long can you stand comfortably? 5 minutes maximum    How long can you walk comfortably? I haven't tried it    Patient Stated Goals reduce my pain and allow return to normal life    Currently in Pain?  Yes    Pain Orientation Left    Pain Descriptors / Indicators Sore    Pain Type Acute pain    Pain Onset More than a month ago    Pain Frequency Constant    Aggravating Factors  transitions from sitting<>standing    Pain Relieving Factors off loading when sitting (leaning forward)                   [facility] Adult PT Treatment/Exercise - 01/02/20 0723      Lumbar Exercises: Stretches   Active Hamstring Stretch 3 reps;Right;Left;30 seconds;Limitations    Active Hamstring Stretch Limitations seated edge of mat with limited range noted. pt reports incr in left LE radicular pain when she increases the stretch on the right side.     Double Knee to Chest Stretch 3 reps;30 seconds;Limitations    Double Knee to Chest Stretch Limitations cues for hold times    Standing Extension 10 reps;Limitations    Standing Extension Limitations cues to stay in pain free ranges      Lumbar Exercises: Standing   Other Standing Lumbar Exercises with green band around LE"s above knee- side stepping left<>Right for 3 laps each way with cues for abd bracing/posture; "cowboy" fwd/bwd, then "electric slide"  fwd/bwd for 3 laps each with cues on form/technique, to maintain squat position.       Lumbar Exercises: Seated   Other Seated Lumbar Exercises seated on large blue pball: bouncing for 1 minute with emphasis on tall posture/core bracing throughout; pelvic rocks laterallly, then ant/post for ~10 reps each with cues on posture/abd bracing; alteranting long arc quads, then alternating combo contralateral sides for marching/UE raises, 10 reps each bil sides. cues for abd bracing; trunk/core stretching as follows- lateral reaching up with contralateral hip hike for 10 reps each side, then cross body reaching fwd with contralateral pelvic rollling out/back for 10 reps each side.       Lumbar Exercises: Supine   Pelvic Tilt 10 reps;5 seconds;Limitations    Pelvic Tilt Limitations cues to hold abd bracing entire time     Bent Knee Raise 15 reps;5 seconds;Limitations    Bent Knee Raise Limitations cues to maintain abd bracing with each rep of LE raise    Bridge 15 reps;5 seconds;Limitations    Bridge Limitations cues to maintain abd bracing and for hold times      Manual Therapy   Manual Therapy Muscle Energy Technique    Muscle Energy Technique to correct rotation- had pt push left LE into mat to engage hamstrings while performing isometric push with bil hands to right knee (knee/hip flexed up) to engage quads. 3 sets of 5 sec holds for 5 reps .               PT Short Term Goals - 12/05/19 0934      PT SHORT TERM GOAL #1   Title Patient will be independent with HEP to foster improved strength and reduced pain. 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    Baseline MET 12/05/19: lumbar extension = 23 degrees    Time 4    Period Weeks    Status Achieved      PT SHORT TERM GOAL #3   Title Patient will demonstrate >/= 40 degrees of bilateral hip rotation to decrease reliance on compensatory movements during ADLs to reduce injury risk.    Baseline MET 12/05/19: L = 45 degrees, R = 46 degrees    Time 4    Period Weeks    Status Achieved      PT SHORT TERM GOAL #4   Title Patient will report she is able to tolerate standing activities (such as meal prep) for >/= 15 minutes with use of exercise/positioning as needed to indicate improvement in ability to perform ADLs.    Baseline MET 12/05/19: patient reports she is able to stand for long enough  periods of time to get ready at bathroom sink and meal preps for > 15 minutes with no issues    Time 4    Period Weeks    Status Achieved             PT Long Term Goals - 11/07/19  [place] #1   Title Patient will be independent with HEP to foster improved strength and reduced pain. (ALL LTGs DUE 01/06/20)    Time 8    Period Weeks    Status New    Target Date 01/06/20      PT LONG TERM GOAL #2   Title Patient will demonstrate >/= 45 degrees of bilateral hip rotation to decrease reliance on compensatory movements during ADLs to reduce injury risk.    Time 8    Period Weeks    Status New      PT LONG TERM GOAL #3   Title Patient's bilateral hip flexors, extensors, and abductors will be 5/5 to demonstrate improvement in functional mobility.    Time 8    Period Weeks    Status New      PT LONG TERM GOAL #4   Title Patient will have >/= 20 degrees of lumbar extension in order to demonstrate a decreased reliance on compensatory movement strategies during ADLs.    Time 8    Period Weeks    Status New      PT LONG TERM GOAL #5   Title Patient will report she is able to fully return to all work related duties with ability to verbalize changes in mechanics/ergonomics as needed to modify her work day/environment to decrease risk of further injury.    Time 8    Period Weeks    Status New                 Plan - 01/02/20 3005    Clinical Impression Statement Today's skilled session initially continued to address pt's left anterior innominate rotation with MET's with improvement noted with long sit test after 3 sets performed. Remainder of session focused on core/hip strengthening and stretching. No increase in pain reported at end of session. The pt is progressing toward goals.    Personal Factors and Comorbidities Comorbidity 1    Comorbidities L4/L5 and L5/S1 disc herniation    Examination-Activity Limitations Sit;Sleep;Lift;Squat;Locomotion  Level;Stairs;Carry;Stand    Examination-Participation Restrictions Interpersonal Relationship;Yard Work;Cleaning;Laundry;Community Activity;Shop;Driving;Meal Prep    Stability/Clinical Decision Making Stable/Uncomplicated    Rehab Potential Good    PT Frequency 2x / week    PT Duration 8 weeks    PT Treatment/Interventions ADLs/Self Care Home Management;Aquatic Therapy;Biofeedback;Traction;Moist Heat;Electrical Stimulation;Cryotherapy;Ultrasound;DME Instruction;Gait training;Therapeutic exercise;Therapeutic activities;Functional mobility training;Stair training;Balance training;Neuromuscular re-education;Cognitive remediation;Patient/family education;Manual techniques;Passive range of motion;Energy conservation;Spinal Manipulations;Joint Manipulations;Dry needling    PT Next Visit Plan [facility] next week for ERO visit to determine d/c vs. recertification to continue pending status and need for further skilled PT    PT Home Exercise Plan Access Code: [email or web address]    Consulted and Agree with Plan of Care Patient           Patient will benefit from skilled therapeutic intervention in order to improve the following deficits and impairments:  Abnormal gait, Decreased balance, Decreased endurance, Decreased mobility, Difficulty walking, Decreased range of motion, Impaired perceived functional ability, Improper body mechanics, Decreased activity tolerance, Decreased strength, Impaired flexibility, Postural dysfunction, Pain  Visit Diagnosis: Pain in left hip  Stiffness of left hip, not elsewhere classified  Radiculopathy, lumbar region  Stiffness of right hip, not elsewhere classified  Acute midline low back pain, unspecified whether sciatica present  Other abnormalities of gait and mobility     Problem List There are no problems to display for this patient.  Willow Ora, PTA, [facility] [address], [place], [place]  [id number] [phone] 01/02/20, 2:42 PM   Name: Kelly Montoya MRN: [id number] Date of Birth: 1991-02-26

## 2020-01-05 ENCOUNTER — Other Ambulatory Visit: Payer: Self-pay

## 2020-01-05 ENCOUNTER — Encounter: Payer: Self-pay | Admitting: Physical Therapy

## 2020-01-05 ENCOUNTER — Ambulatory Visit: Payer: BC Managed Care – PPO | Admitting: Physical Therapy

## 2020-01-05 DIAGNOSIS — M25552 Pain in left hip: Secondary | ICD-10-CM

## 2020-01-05 DIAGNOSIS — M25651 Stiffness of right hip, not elsewhere classified: Secondary | ICD-10-CM

## 2020-01-05 DIAGNOSIS — M545 Low back pain, unspecified: Secondary | ICD-10-CM

## 2020-01-05 DIAGNOSIS — M5416 Radiculopathy, lumbar region: Secondary | ICD-10-CM

## 2020-01-05 DIAGNOSIS — M25652 Stiffness of left hip, not elsewhere classified: Secondary | ICD-10-CM

## 2020-01-05 DIAGNOSIS — R2689 Other abnormalities of gait and mobility: Secondary | ICD-10-CM

## 2020-01-05 NOTE — Therapy (Signed)
[facility] [place], [place], [address] Phone: [phone]   Fax:  [phone]  Physical Therapy Treatment/Discharge  Patient Details  Name: Kelly Montoya MRN: [id number] Date of Birth: 1990-09-30 Referring Provider (PT): Rachell Cipro MD   Encounter Date: 01/05/2020   PT End of Session - 01/05/20 1336    Visit Number 18    Number of Visits 18    Date for PT Re-Evaluation 01/06/20    Authorization Type BCBS    PT Start Time 3845    PT Stop Time 1419    PT Time Calculation (min) 51 min    Activity Tolerance Patient tolerated treatment well    Behavior During Therapy [facility] for tasks assessed/performed           Past Medical History:  Diagnosis Date  . Allergy   . Anemia   . Breast cyst   . Family history of breast cancer    My Risk neg 2/17, NBN VUS  . Family history of ovarian cancer   . Increased risk of breast cancer    IBIS Lifetime risk 25.4%; MyRisk neg  . Neuromuscular disorder [facility])     Past Surgical History:  Procedure Laterality Date  . MANDIBLE SURGERY Bilateral 01/012011   nerve damage with numbness on the chin on the right side.    There were no vitals filed for this visit.   Subjective Assessment - 01/05/20 1332    Subjective Overall feels good but did have some brief radiating pain in left LE earlier this AM. Discussed status and for now given improvement symptoms seem manageable with HEP so plan d/c with today's visit and will follow up with MD/get new referral with any changes in status/if future PT needed.    Pertinent History L4/5 and L5/S1 herniated disc    Limitations House hold activities;Lifting;Standing;Walking    How long can you sit comfortably? No limitations    How long can you stand comfortably? over an hour-around 90 minutes    How long can you walk comfortably? 30-45 minutes to an hour    Patient Stated Goals reduce my pain and allow return to normal life     Currently in Pain? No/denies              [facility] PT Assessment - 01/05/20 0001      AROM   Right Hip External Rotation  49    Left Hip External Rotation  50    Lumbar Flexion 85    Lumbar Extension 28    Lumbar - Right Side Bend 22    Lumbar - Left Side Bend 25    Lumbar - Right Rotation WFL    Lumbar - Left Rotation [facility]      Strength   Overall Strength Comments Bilat. LE MMTs grossly 5/5                         [facility] Adult PT Treatment/Exercise - 01/05/20 0001      Lumbar Exercises: Stretches   Other Lumbar Stretch Exercise Brief HEP review nerve glides, parametes for core strengthening and avoiding excessive trunk flexion with exercise      Traction   Type of Traction Lumbar    Min (lbs) 55    Max (lbs) 95    Hold Time 60    Rest Time 20    Time 16      Manual Therapy   Joint Mobilization LAD  bilat. hips grade I-III oscillations    Muscle Energy Technique --   HEP instruction-self METs for innonimate rotation           Trigger Point Dry Needling - 01/05/20 0001    Consent Given? Yes    Education Handout Provided Previously provided    Muscles Treated Back/Hip Erector spinae;Lumbar multifidi    Dry Needling Comments bilat. lumbar longissimus and multifidi L4-5 levels with 32 gauge 50 mm needles    Electrical Stimulation Performed with Dry Needling Yes    E-stim with Dry Needling Details TENS 2 pps x 10 minutes                  PT Short Term Goals - 01/05/20 1336      PT SHORT TERM GOAL #1   Title Patient will be independent with HEP to foster improved strength and reduced pain. (ALL STGs DUE 12/07/19)    Baseline met    Time 4    Period Weeks    Status Achieved      PT SHORT TERM GOAL #2   Title Patient will have >/= 10 degrees of lumbar extension in order to demonstrate a decreased reliance on compensatory movement strategies during ADLs.    Baseline MET 12/05/19: lumbar extension = 23 degrees    Time 4    Period Weeks     Status Achieved      PT SHORT TERM GOAL #3   Title Patient will demonstrate >/= 40 degrees of bilateral hip rotation to decrease reliance on compensatory movements during ADLs to reduce injury risk.    Baseline MET 12/05/19: L = 45 degrees, R = 46 degrees    Time 4    Period Weeks    Status Achieved      PT SHORT TERM GOAL #4   Title Patient will report she is able to tolerate standing activities (such as meal prep) for >/= 15 minutes with use of exercise/positioning as needed to indicate improvement in ability to perform ADLs.    Baseline MET 12/05/19: patient reports she is able to stand for long enough periods of time to get ready at bathroom sink and meal preps for > 15 minutes with no issues    Time 4    Period Weeks    Status Achieved             PT Long Term Goals - 01/05/20 1337      PT LONG TERM GOAL #1   Title Patient will be independent with HEP to foster improved strength and reduced pain. (ALL LTGs DUE 01/06/20)    Baseline met    Time 8    Period Weeks    Status Achieved      PT LONG TERM GOAL #2   Title Patient will demonstrate >/= 45 degrees of bilateral hip rotation to decrease reliance on compensatory movements during ADLs to reduce injury risk.    Baseline met    Time 8    Period Weeks    Status Achieved      PT LONG TERM GOAL #3   Title Patient's bilateral hip flexors, extensors, and abductors will be 5/5 to demonstrate improvement in functional mobility.    Baseline met    Time 8    Period Weeks    Status Achieved      PT LONG TERM GOAL #4   Title Patient will have >/= 20 degrees of lumbar extension in order to demonstrate a decreased reliance  on compensatory movement strategies during ADLs.    Baseline met    Time 8    Period Weeks    Status Achieved      PT LONG TERM GOAL #5   Title Patient will report she is able to fully return to all work related duties with ability to verbalize changes in mechanics/ergonomics as needed to modify her work  day/environment to decrease risk of further injury.    Baseline on Summer break now but doing well in terms of pain    Time 8    Period Weeks    Status Achieved                 Plan - 01/05/20 1336    Clinical Impression Statement Pt. has progressed well with therapy with decreased pain and improved activity tolerance from baseline status at eval. Still having some milder/intermittent symptoms but at this point therapy goals met and hopeful that any residual symptoms can be managed via HEP. Plan d/c to HEP with pt. to follow up with MD with any future changes in status.    Personal Factors and Comorbidities Comorbidity 1    Comorbidities L4/L5 and L5/S1 disc herniation    Examination-Activity Limitations Sit;Sleep;Lift;Squat;Locomotion Level;Stairs;Carry;Stand    Examination-Participation Restrictions Interpersonal Relationship;Yard Work;Cleaning;Laundry;Community Activity;Shop;Driving;Meal Prep    Stability/Clinical Decision Making Stable/Uncomplicated    Clinical Decision Making Low    Rehab Potential Good    PT Frequency 2x / week    PT Duration 8 weeks    PT Treatment/Interventions ADLs/Self Care Home Management;Aquatic Therapy;Biofeedback;Traction;Moist Heat;Electrical Stimulation;Cryotherapy;Ultrasound;DME Instruction;Gait training;Therapeutic exercise;Therapeutic activities;Functional mobility training;Stair training;Balance training;Neuromuscular re-education;Cognitive remediation;Patient/family education;Manual techniques;Passive range of motion;Energy conservation;Spinal Manipulations;Joint Manipulations;Dry needling    PT Next Visit Plan NA    PT Home Exercise Plan Access Code: [email or web address]    Consulted and Agree with Plan of Care Patient           Patient will benefit from skilled therapeutic intervention in order to improve the following deficits and impairments:  Abnormal gait, Decreased balance, Decreased endurance, Decreased mobility, Difficulty walking, Decreased range  of motion, Impaired perceived functional ability, Improper body mechanics, Decreased activity tolerance, Decreased strength, Impaired flexibility, Postural dysfunction, Pain  Visit Diagnosis: Pain in left hip  Stiffness of left hip, not elsewhere classified  Radiculopathy, lumbar region  Stiffness of right hip, not elsewhere classified  Acute midline low back pain, unspecified whether sciatica present  Other abnormalities of gait and mobility     Problem List There are no problems to display for this patient.       PHYSICAL THERAPY DISCHARGE SUMMARY  Visits from Start of Care: 18  Current functional level related to goals / functional outcomes: See above-therapy goals met and plan d/c to HEP   Remaining deficits: NA   Education / Equipment: HEP Plan: Patient agrees to discharge.  Patient goals were met. Patient is being discharged due to meeting the stated rehab goals.  ?????            Beaulah Dinning, PT, DPT 01/05/20 2:27 PM       [facility] [address] [place], [place], [address] Phone: [phone]   Fax:  [phone]  Name: CHARDAE MULKERN MRN: [id number] Date of Birth: [birth year]

## 2020-02-12 ENCOUNTER — Other Ambulatory Visit: Payer: Self-pay | Admitting: Family Medicine

## 2020-02-12 DIAGNOSIS — N644 Mastodynia: Secondary | ICD-10-CM

## 2020-03-03 ENCOUNTER — Ambulatory Visit
Admission: RE | Admit: 2020-03-03 | Discharge: 2020-03-03 | Disposition: A | Discharge status: Home / Self Care | Payer: BC Managed Care – PPO | Source: Ambulatory Visit | Attending: Family Medicine | Admitting: Family Medicine

## 2020-03-03 ENCOUNTER — Other Ambulatory Visit: Payer: Self-pay

## 2020-03-03 DIAGNOSIS — N644 Mastodynia: Secondary | ICD-10-CM

## 2020-09-11 ENCOUNTER — Emergency Department (HOSPITAL_COMMUNITY)
Admission: EM | Admit: 2020-09-11 | Discharge: 2020-09-12 | Disposition: A | Discharge status: Home / Self Care | Payer: BC Managed Care – PPO | Attending: Emergency Medicine | Admitting: Emergency Medicine

## 2020-09-11 ENCOUNTER — Encounter (HOSPITAL_COMMUNITY): Payer: Self-pay

## 2020-09-11 ENCOUNTER — Emergency Department (HOSPITAL_COMMUNITY): Payer: BC Managed Care – PPO

## 2020-09-11 ENCOUNTER — Other Ambulatory Visit: Payer: Self-pay

## 2020-09-11 DIAGNOSIS — W228XXA Striking against or struck by other objects, initial encounter: Secondary | ICD-10-CM | POA: Diagnosis not present

## 2020-09-11 DIAGNOSIS — S82851A Displaced trimalleolar fracture of right lower leg, initial encounter for closed fracture: Secondary | ICD-10-CM | POA: Diagnosis not present

## 2020-09-11 DIAGNOSIS — Y9339 Activity, other involving climbing, rappelling and jumping off: Secondary | ICD-10-CM | POA: Diagnosis not present

## 2020-09-11 DIAGNOSIS — S99911A Unspecified injury of right ankle, initial encounter: Secondary | ICD-10-CM | POA: Diagnosis present

## 2020-09-11 DIAGNOSIS — T1490XA Injury, unspecified, initial encounter: Secondary | ICD-10-CM

## 2020-09-11 MED ORDER — HYDROMORPHONE HCL 1 MG/ML IJ SOLN
1.0000 mg | Freq: Once | INTRAMUSCULAR | Status: AC
  Administered 2020-09-11: 1 mg via INTRAVENOUS
  Filled 2020-09-11: qty 1

## 2020-09-11 MED ORDER — PROPOFOL 10 MG/ML IV BOLUS
INTRAVENOUS | Status: AC | PRN
  Administered 2020-09-11: 60 mg via INTRAVENOUS
  Administered 2020-09-11: 40 mg via INTRAVENOUS

## 2020-09-11 MED ORDER — PROPOFOL 10 MG/ML IV BOLUS
INTRAVENOUS | Status: AC
  Filled 2020-09-11: qty 20

## 2020-09-11 MED ORDER — HYDROMORPHONE HCL 1 MG/ML IJ SOLN
0.5000 mg | Freq: Once | INTRAMUSCULAR | Status: AC
Start: 2020-09-11 — End: 2020-09-11
  Administered 2020-09-11: 0.5 mg via INTRAVENOUS
  Filled 2020-09-11: qty 1

## 2020-09-11 MED ORDER — PROPOFOL 10 MG/ML IV BOLUS
0.5000 mg/kg | Freq: Once | INTRAVENOUS | Status: DC
  Filled 2020-09-11: qty 20

## 2020-09-11 MED ORDER — PROPOFOL 10 MG/ML IV BOLUS
INTRAVENOUS | Status: AC | PRN
  Administered 2020-09-11: 40 mg via INTRAVENOUS

## 2020-09-11 MED ORDER — CEFAZOLIN SODIUM-DEXTROSE 2-4 GM/100ML-% IV SOLN: 2.0000 g | Freq: Once | INTRAVENOUS | Status: DC

## 2020-09-11 NOTE — ED Provider Notes (Signed)
[facility] DEPT Provider Note   CSN: [id number] Arrival date & time: 09/11/20  2200     History Chief Complaint  Patient presents with   Ankle Injury    Kelly Montoya is a 30 y.o. female with a hx of anemia who presents to the emergency department with complaints of right ankle injury that occurred shortly prior to arrival.  Patient states that she jumped up to hit a parking sign with her hand and accidentally landed incorrectly on her right ankle resulting in injury.  She denies head injury or loss of consciousness.  She is having excruciating pain to the right ankle, worse with any attempts of movement, no alleviating factors.  She denies any other areas of injury.  She does admit to having 4-5 alcoholic beverages this evening.  She states that her toes are tingling some but denies overt numbness.  She denies weakness, chest pain, abdominal pain, headache, neck pain, or back pain.  Denies chance of pregnancy.  Has had a tetanus within the past 10 years.  HPI     Past Medical History:  Diagnosis Date   Allergy    Anemia    Breast cyst    Family history of breast cancer    My Risk neg 2/17, NBN VUS   Family history of ovarian cancer    Increased risk of breast cancer    IBIS Lifetime risk 25.4%; MyRisk neg   Neuromuscular disorder ([place])     There are no problems to display for this patient.   Past Surgical History:  Procedure Laterality Date   MANDIBLE SURGERY Bilateral 01/012011   nerve damage with numbness on the chin on the right side.     OB History    Gravida  0   Para  0   Term  0   Preterm  0   AB  0   Living  0     SAB  0   IAB  0   Ectopic  0   Multiple  0   Live Births  0           Family History  Problem Relation Age of Onset   Breast cancer Maternal Aunt 70   Breast cancer Maternal Grandmother 70   Cerebral aneurysm Maternal Grandmother    Breast cancer Maternal Grandfather 80    Ovarian cancer Maternal Grandfather    Stroke Maternal Grandfather    Macular degeneration Maternal Grandfather    Ovarian cancer Paternal Grandmother 73   Breast cancer Cousin        maternal 2nd cousin   Breast cancer Maternal Uncle    Parkinson's disease Paternal Aunt     Social History   Tobacco Use   Smoking status: Never Smoker   Smokeless tobacco: Never Used  Vaping Use   Vaping Use: Never used  Substance Use Topics   Alcohol use: Yes    Alcohol/week: 1.0 standard drink    Types: 1 Glasses of wine per week    Comment: socially   Drug use: No    Home Medications Prior to Admission medications   Medication Sig Start Date End Date Taking? Authorizing Provider  cholecalciferol (VITAMIN D) 1000 units tablet Take 1,000 Units by mouth daily.    [provider]  cyclobenzaprine (FLEXERIL) 5 MG tablet Take 5 mg by mouth 3 (three) times daily as needed for muscle spasms. Patient unsure of the strength of this medication. Will bring to next appointment.  [provider]  etonogestrel-ethinyl estradiol (NUVARING) 0.12-0.015 MG/24HR vaginal ring Place 1 each vaginally every 28 (twenty-eight) days. Insert vaginally and leave in place for 3 consecutive weeks, then remove for 1 week.    [provider]  [place] 1/35, 28, tablet TAKE 1 TABLET BY MOUTH DAILY Patient not taking: Reported on 11/07/2019 11/01/17   Rod Can, CNM    Allergies    Patient has no known allergies.  Review of Systems   Review of Systems  Constitutional: Negative for chills and fever.  Respiratory: Negative for shortness of breath.   Cardiovascular: Negative for chest pain.  Gastrointestinal: Negative for abdominal pain.  Musculoskeletal: Positive for arthralgias and joint swelling.  Skin: Positive for wound.  Neurological: Negative for weakness and numbness.       Positive for intermittent paresthesias to the right digits.  All other systems reviewed and are  negative.   Physical Exam Updated Vital Signs BP 95/80    Pulse 87    Temp 98.7 F (37.1 C) (Oral)    Resp 17    Ht 5\' 8"  (1.727 m)    Wt 85 kg    SpO2 100%    BMI 28.49 kg/m   Physical Exam Vitals and nursing note reviewed.  Constitutional:      General: She is in acute distress.     Appearance: She is well-developed. She is not toxic-appearing.  HENT:     Head: Normocephalic and atraumatic.     Comments: No racoon eyes or battle sign.  Eyes:     General:        Right eye: No discharge.        Left eye: No discharge.     Conjunctiva/sclera: Conjunctivae normal.  Neck:     Comments: No midline tenderness. Cardiovascular:     Rate and Rhythm: Normal rate and regular rhythm.     Comments: 2+ symmetric DP pulses bilaterally. Pulmonary:     Effort: Pulmonary effort is normal. No respiratory distress.     Breath sounds: Normal breath sounds. No wheezing, rhonchi or rales.  Chest:     Chest wall: No tenderness.  Abdominal:     General: There is no distension.     Palpations: Abdomen is soft.     Tenderness: There is no abdominal tenderness. There is no guarding or rebound.  Musculoskeletal:     Cervical back: Neck supple.     Comments: Upper extremities: Moving in all major joints not focal bony tenderness Back: No midline tenderness Lower extremities: Obvious deformity of the right ankle with an overlying abrasion just proximal to the medial malleoli region. Soft tissue swelling.  There is no significant subcutaneous tissue exposure.  There is no bone protruding through the wound.  This does not appear consistent with an open fracture.  She is exquisitely tender over the right ankle diffusely, lower extremities are otherwise nontender.  She is moving the left lower extremity all joints without difficulty.  Right lower extremity movement limited secondary to significant pain in the right ankle but no tenderness throughout the remainder of the right lower extremity other than the  ankle.  Skin:    General: Skin is warm and dry.     Findings: No rash.     Comments: Less than 2-second cap refill to all toes.  Neurological:     Mental Status: She is alert.     Comments: Clear speech.  Sensation grossly intact bilateral lower extremities.  Able to move all  digits.  Psychiatric:        Behavior: Behavior normal.    ED Results / Procedures / Treatments   Labs (all labs ordered are listed, but only abnormal results are displayed) Labs Reviewed - No data to display  EKG None  Radiology CT Ankle Right Wo Contrast  Result Date: 09/12/2020 CLINICAL DATA:  Severe right ankle pain after landing on foot after jumping EXAM: CT OF THE RIGHT ANKLE WITHOUT CONTRAST TECHNIQUE: Multidetector CT imaging of the right ankle was performed according to the standard protocol. Multiplanar CT image reconstructions were also generated. COMPARISON:  Radiographs 09/11/2020 FINDINGS: Bones/Joint/Cartilage Redemonstration of the unstable trimalleolar fracture of the ankle. Individual fracture components include a mildly comminuted, obliquely oriented trans syndesmotic fracture of the distal fibula, a minimally displaced obliquely oriented comminuted fracture of the medial malleolus and a vertically oriented minimally comminuted, intra-articular fracture of the posterior malleolus. There is slight residual lateral talar shift. Associated ankle joint effusion is present with displacement of several fracture fragments into the joint space. Ovoid ossification adjacent the posterior talar process appears fairly corticated and is favored to reflect a small os trigonum. No other acute fracture or suspicious osseous lesions are identified. Bones of the mid and hindfoot as included are unremarkable and normally aligned. Ligaments Suboptimally assessed by CT. Constellation of findings are compatible with a Lauge-Hansen stage IV injury which is indicative of ligamentous instability of the ankle. Muscles and  Tendons No clearly retracted or torn tendons. Minimal thickening and stranding noted along the flexor digitorum and tibialis posterior tendons as they pass in vicinity of the medial malleolar fracture. No some additional stranding along the fibular is longus and brevis adjacent the fibular fractures. No intramuscular collections are identified. Soft tissues Circumferential soft tissue swelling of the ankle with effusion, as above. IMPRESSION: 1. Redemonstration of a trimalleolar fracture compatible with a Weber B, Lauge Hansen stage IV unstable ankle injury, likely the result of a supination external rotation mechanism. Circumferential swelling and ankle effusion. Tiny comminuted fragments are displaced into the joint space. 2. Minimal thickening and stranding along the flexor digitorum and tibialis posterior tendons as they pass in vicinity of the medial malleolar fracture. Similar stranding along the fibular is longus and brevis adjacent the low lateral malleolar/fibular fracture, correlate for tendinosis. No clearly retracted or torn tendons. Electronically Signed   By: Lovena Le M.D.   On: 09/12/2020 00:12   DG Ankle Right Port  Result Date: 09/11/2020 CLINICAL DATA:  Right ankle pain after injury. EXAM: PORTABLE RIGHT ANKLE - 2 VIEW COMPARISON:  None. FINDINGS: Displaced distal fibular fracture. Transverse medial malleolar fracture. There is a posterior tibial tubercle fracture. Lateral subluxation of the talus and distal malleolar fragments with respect to the tibial plafond. Disruption of the ankle mortise. Generalized soft tissue edema. IMPRESSION: Trimalleolar fracture with lateral subluxation of the talus and distal malleolar fragments. Electronically Signed   By: Keith Rake M.D.   On: 09/11/2020 22:34    Procedures Reduction of fracture  Date/Time: 09/11/2020 11:23 PM Performed by: Amaryllis Dyke, PA-C Authorized by: Amaryllis Dyke, PA-C  Consent: Verbal consent  obtained. Risks and benefits: risks, benefits and alternatives were discussed Consent given by: patient Patient understanding: patient states understanding of the procedure being performed Patient consent: the patient's understanding of the procedure matches consent given Procedure consent: procedure consent matches procedure scheduled Relevant documents: relevant documents present and verified Test results: test results available and properly labeled Site marked: the operative site was  marked Imaging studies: imaging studies available Required items: required blood products, implants, devices, and special equipment available Patient identity confirmed: verbally with patient Time out: Immediately prior to procedure a "time out" was called to verify the correct patient, procedure, equipment, support staff and site/side marked as required.  Sedation: Patient sedated: yes Sedatives: propofol Analgesia: hydromorphone Sedation start date/time: 09/11/2020 10:36 PM Sedation end date/time: 09/11/2020 10:48 PM Vitals: Vital signs were monitored during sedation.  Patient tolerance: patient tolerated the procedure well with no immediate complications Comments: Attending Dr. Vanita Panda present @ bedside during procedure.       Medications Ordered in ED Medications  propofol (DIPRIVAN) 10 mg/mL bolus/IV push 42.5 mg (42.5 mg Intravenous Not Given 09/11/20 2314)  ketorolac (TORADOL) 15 MG/ML injection 15 mg (has no administration in time range)  oxyCODONE-acetaminophen (PERCOCET/ROXICET) 5-325 MG per tablet 1 tablet (has no administration in time range)  HYDROmorphone (DILAUDID) injection 1 mg (1 mg Intravenous Given 09/11/20 2229)  propofol (DIPRIVAN) 10 mg/mL bolus/IV push (40 mg Intravenous Given 09/11/20 2240)  propofol (DIPRIVAN) 10 mg/mL bolus/IV push (40 mg Intravenous Given 09/11/20 2242)  HYDROmorphone (DILAUDID) injection 0.5 mg (0.5 mg Intravenous Given 09/11/20 2333)    ED Course  I have  reviewed the triage vital signs and the nursing notes.  Pertinent labs & imaging results that were available during my care of the patient were reviewed by me and considered in my medical decision making (see chart for details).    MDM Rules/Calculators/A&P                         Patient presents to the ED for evaluation of R ankle injury which occurred just PTA. + EtOH prior to arrival. Reports isolated injury. Appears uncomfortable, vitals without significant abnormality. No other signs of trauma.  Additional history obtained:  Additional history obtained from patient's friend @ bedside.   Imaging Studies ordered:  I ordered imaging studies which included right ankle x-ray, I independently reviewed, formal radiology impression shows:  Trimalleolar fracture with lateral subluxation of the talus and distal malleolar fragments.  Small overlying abrasion however very superficial- does not appear clinically consistent w/ open fracture. This wound was cleansed and a nonstick dressing was placed. Her tetanus was within the past 10 years. Supervising physician Dr. Vanita Panda spoke with Dr. Rolena Infante, recommends ED reduction with subsequent CT, call back once CT performed.  Reduction performed per procedure note above.  Neurovascularly intact distally prior to and following procedure with 2+ symmetric DP pulses, less than 2-second cap refill, gross intact sensation, inability to move all digits.   00:08: CONSULT: Re-discussed case with Dr. Rolena Infante, has reviewed CT imaging, okay to discharge home with crutches and nonweightbearing status on the right lower extremity-call the office Monday morning to follow-up with Dr. Doran Durand Monday or Tuesday.  Patient able to ambulate with crutches. Having some increased pain, discussed w/ Dr. Stark Jock - attending @ change of shift- in agreement with toradol & oral percocet for additional pain control.  Will discharge home with analgesics as well. [place] Controlled  Substance reporting System queried. Recommended PRICE. I discussed results, treatment plan, need for follow-up, and return precautions with the patient& her firend @ bedside. Provided opportunity for questions, patient & her friend confirmed understanding and are in agreement with plan.   This is a shared visit with supervising physician Dr. Vanita Panda who has independently evaluated patient & provided guidance in evaluation/management/disposition, in agreement with care   Portions  of this note were generated with [employer]. Dictation errors may occur despite best attempts at proofreading.  Final Clinical Impression(s) / ED Diagnoses Final diagnoses:  Trauma  Closed trimalleolar fracture of right ankle, initial encounter    Rx / DC Orders ED Discharge Orders         Ordered    oxyCODONE-acetaminophen (PERCOCET/ROXICET) 5-325 MG tablet  Every 6 hours PRN        09/12/20 0049           Amaryllis Dyke, PA-C 09/12/20 0105    Carmin Muskrat, MD 09/16/20 2348

## 2020-09-11 NOTE — ED Triage Notes (Signed)
Pt reports she jumped and landed on her foot wrong. Pt now endorses severe right ankle pain. Obvious deformity and swelling. Mild abrasions on top of her foot.

## 2020-09-12 MED ORDER — OXYCODONE-ACETAMINOPHEN 5-325 MG PO TABS
1.0000 | ORAL_TABLET | Freq: Once | ORAL | Status: AC
  Administered 2020-09-12: 1 via ORAL
  Filled 2020-09-12: qty 1

## 2020-09-12 MED ORDER — KETOROLAC TROMETHAMINE 15 MG/ML IJ SOLN
15.0000 mg | Freq: Once | INTRAMUSCULAR | Status: AC
  Administered 2020-09-12: 15 mg via INTRAVENOUS
  Filled 2020-09-12: qty 1

## 2020-09-12 MED ORDER — OXYCODONE-ACETAMINOPHEN 5-325 MG PO TABS: 1.0000 | ORAL_TABLET | Freq: Four times a day (QID) | ORAL | 0 refills | Status: DC | PRN

## 2020-09-12 NOTE — Discharge Instructions (Addendum)
Please read and follow all provided instructions.  You have been seen today after an injury to your right ankle Your xray shows a trimalleolar fracture- multiple fractures in your ankle.  You were given medication to make you sleepy to reduce your fracture, do not drive or operate heavy machinery for 24 hours. We have placed you in a splint- please keep this clean & dry and intact until you have followed up with orthopedics. Do not put any weight on this extremity Please call orthopedic surgery Monday morning to schedule an appointment as soon as possible.   Home care instructions: -- *PRICE in the first 24-48 hours after injury: Protect with splint Rest Ice- Do not apply ice pack directly to your splint place towel or similar between your splint and ice/ice pack. Apply ice for 20 min, then remove for 40 min while awake Compression- splint Elevate affected extremity above the level of your heart when not walking around for the first 24-48 hours   Medications:  Please take ibuprofen per over the counter dosing to help with pain/swelling.  If your pain is not alleviated by ibuprofen please take percocet.  -Percocet-this is a narcotic/controlled substance medication that has potential addicting qualities.  We recommend that you take 1-2 tablets every 6 hours as needed for severe pain.  Do not drive or operate heavy machinery when taking this medicine as it can be sedating. Do not drink alcohol or take other sedating medications when taking this medicine for safety reasons.  Keep this out of reach of small children.  Please be aware this medicine has Tylenol in it (325 mg/tab) do not exceed the maximum dose of Tylenol in a day per over the counter recommendations should you decide to supplement with Tylenol over the counter.   We have prescribed you new medication(s) today. Discuss the medications prescribed today with your pharmacist as they can have adverse effects and interactions with your other  medicines including over the counter and prescribed medications. Seek medical evaluation if you start to experience new or abnormal symptoms after taking one of these medicines, seek care immediately if you start to experience difficulty breathing, feeling of your throat closing, facial swelling, or rash as these could be indications of a more serious allergic reaction   Follow-up instructions: Please follow-up with the orthopedic surgeon in your discharge instructions   Return instructions:  Please return if your digits or extremity are numb or tingling, appear gray or blue, or you have severe pain (also elevate the extremity and loosen splint or wrap if you were given one) Please return if you have redness or fevers.  Please return to the Emergency Department if you experience worsening symptoms.  Please return if you have any other emergent concerns. Additional Information:  Your vital signs today were: BP 95/80   Pulse 87   Temp 98.7 F (37.1 C) (Oral)   Resp 17   Ht 5\' 8"  (1.727 m)   Wt 85 kg   SpO2 100%   BMI 28.49 kg/m  If your blood pressure (BP) was elevated above 135/85 this visit, please have this repeated by your doctor within one month. ---------------

## 2020-09-15 ENCOUNTER — Other Ambulatory Visit: Payer: Self-pay

## 2020-09-15 ENCOUNTER — Other Ambulatory Visit (HOSPITAL_COMMUNITY): Payer: Self-pay | Admitting: Orthopedic Surgery

## 2020-09-15 ENCOUNTER — Encounter (HOSPITAL_BASED_OUTPATIENT_CLINIC_OR_DEPARTMENT_OTHER): Payer: Self-pay | Admitting: Orthopedic Surgery

## 2020-09-15 DIAGNOSIS — S82851A Displaced trimalleolar fracture of right lower leg, initial encounter for closed fracture: Secondary | ICD-10-CM | POA: Insufficient documentation

## 2020-09-16 NOTE — ED Provider Notes (Signed)
.  Sedation  Date/Time: 09/11/2020 10:15 PM Performed by: Carmin Muskrat, MD Authorized by: Carmin Muskrat, MD   Consent:    Consent obtained:  Verbal   Consent given by:  Patient   Risks discussed:  Allergic reaction, dysrhythmia, inadequate sedation, nausea, prolonged hypoxia resulting in organ damage, prolonged sedation necessitating reversal, respiratory compromise necessitating ventilatory assistance and intubation and vomiting   Alternatives discussed:  Analgesia without sedation, anxiolysis and regional anesthesia Universal protocol:    Procedure explained and questions answered to patient or proxy's satisfaction: yes     Relevant documents present and verified: yes     Test results available: yes     Imaging studies available: yes     Required blood products, implants, devices, and special equipment available: yes     Site/side marked: yes     Immediately prior to procedure, a time out was called: yes     Patient identity confirmed:  Verbally with patient Indications:    Procedure necessitating sedation performed by:  Physician performing sedation Pre-sedation assessment:    Time since last food or drink:  1   ASA classification: class 1 - normal, healthy patient     Mouth opening:  3 or more finger widths   Thyromental distance:  4 finger widths   Mallampati score:  I - soft palate, uvula, fauces, pillars visible   Neck mobility: normal     Pre-sedation assessments completed and reviewed: airway patency, cardiovascular function, hydration status, mental status, nausea/vomiting, pain level, respiratory function and temperature     Pre-sedation assessment completed:  09/11/2020 10:15 PM Immediate pre-procedure details:    Reassessment: Patient reassessed immediately prior to procedure     Reviewed: vital signs, relevant labs/tests and NPO status     Verified: bag valve mask available, emergency equipment available, intubation equipment available, IV patency confirmed, oxygen  available and suction available   Procedure details (see MAR for exact dosages):    Preoxygenation:  Nasal cannula   Sedation:  Propofol   Intended level of sedation: deep   Intra-procedure monitoring:  Blood pressure monitoring, cardiac monitor, continuous pulse oximetry, frequent LOC assessments, frequent vital sign checks and continuous capnometry   Intra-procedure events: none     Total Provider sedation time (minutes):  20 Post-procedure details:    Post-sedation assessment completed:  09/11/2020 11:10 PM   Attendance: Constant attendance by certified staff until patient recovered     Recovery: Patient returned to pre-procedure baseline     Post-sedation assessments completed and reviewed: airway patency, cardiovascular function, hydration status, mental status, nausea/vomiting, pain level, respiratory function and temperature     Patient is stable for discharge or admission: yes     Procedure completion:  Tolerated well, no immediate complications      Carmin Muskrat, MD 09/16/20 2350

## 2020-09-21 ENCOUNTER — Other Ambulatory Visit (HOSPITAL_COMMUNITY): Payer: BC Managed Care – PPO

## 2020-09-22 ENCOUNTER — Other Ambulatory Visit (HOSPITAL_COMMUNITY): Payer: BC Managed Care – PPO

## 2020-09-23 ENCOUNTER — Encounter (HOSPITAL_BASED_OUTPATIENT_CLINIC_OR_DEPARTMENT_OTHER): Payer: Self-pay | Admitting: Orthopedic Surgery

## 2020-09-23 ENCOUNTER — Encounter (HOSPITAL_BASED_OUTPATIENT_CLINIC_OR_DEPARTMENT_OTHER)
Admission: RE | Disposition: A | Discharge status: Home / Self Care | Payer: Self-pay | Source: Home / Self Care | Attending: Orthopedic Surgery

## 2020-09-23 ENCOUNTER — Ambulatory Visit (HOSPITAL_BASED_OUTPATIENT_CLINIC_OR_DEPARTMENT_OTHER)
Admission: RE | Admit: 2020-09-23 | Discharge: 2020-09-23 | Disposition: A | Discharge status: Home / Self Care | Payer: BC Managed Care – PPO | Attending: Orthopedic Surgery | Admitting: Orthopedic Surgery

## 2020-09-23 ENCOUNTER — Other Ambulatory Visit: Payer: Self-pay

## 2020-09-23 ENCOUNTER — Ambulatory Visit (HOSPITAL_BASED_OUTPATIENT_CLINIC_OR_DEPARTMENT_OTHER): Payer: BC Managed Care – PPO | Admitting: Anesthesiology

## 2020-09-23 DIAGNOSIS — W19XXXA Unspecified fall, initial encounter: Secondary | ICD-10-CM | POA: Diagnosis not present

## 2020-09-23 DIAGNOSIS — Z793 Long term (current) use of hormonal contraceptives: Secondary | ICD-10-CM | POA: Diagnosis not present

## 2020-09-23 DIAGNOSIS — S82851A Displaced trimalleolar fracture of right lower leg, initial encounter for closed fracture: Secondary | ICD-10-CM | POA: Diagnosis not present

## 2020-09-23 DIAGNOSIS — Z79899 Other long term (current) drug therapy: Secondary | ICD-10-CM | POA: Insufficient documentation

## 2020-09-23 HISTORY — DX: Anxiety disorder, unspecified: F41.9

## 2020-09-23 HISTORY — DX: Depression, unspecified: F32.A

## 2020-09-23 HISTORY — PX: ORIF ANKLE FRACTURE: SHX5408

## 2020-09-23 LAB — POCT PREGNANCY, URINE: Preg Test, Ur: NEGATIVE

## 2020-09-23 SURGERY — OPEN REDUCTION INTERNAL FIXATION (ORIF) ANKLE FRACTURE
Anesthesia: General | Site: Ankle | Laterality: Right

## 2020-09-23 MED ORDER — ONDANSETRON HCL 4 MG/2ML IJ SOLN
INTRAMUSCULAR | Status: AC
  Filled 2020-09-23: qty 2

## 2020-09-23 MED ORDER — ROPIVACAINE HCL 5 MG/ML IJ SOLN
INTRAMUSCULAR | Status: DC | PRN
  Administered 2020-09-23: 50 mL via PERINEURAL

## 2020-09-23 MED ORDER — FENTANYL CITRATE (PF) 100 MCG/2ML IJ SOLN
INTRAMUSCULAR | Status: DC | PRN
  Administered 2020-09-23: 50 ug via INTRAVENOUS
  Administered 2020-09-23 (×2): 25 ug via INTRAVENOUS

## 2020-09-23 MED ORDER — VANCOMYCIN HCL 500 MG IV SOLR
INTRAVENOUS | Status: AC
  Filled 2020-09-23: qty 500

## 2020-09-23 MED ORDER — PROMETHAZINE HCL 25 MG/ML IJ SOLN: 6.2500 mg | INTRAMUSCULAR | Status: DC | PRN

## 2020-09-23 MED ORDER — DOCUSATE SODIUM 100 MG PO CAPS: 100.0000 mg | ORAL_CAPSULE | Freq: Two times a day (BID) | ORAL | 0 refills | Status: DC

## 2020-09-23 MED ORDER — 0.9 % SODIUM CHLORIDE (POUR BTL) OPTIME
TOPICAL | Status: DC | PRN
  Administered 2020-09-23: 200 mL

## 2020-09-23 MED ORDER — VANCOMYCIN HCL 500 MG IV SOLR
INTRAVENOUS | Status: DC | PRN
  Administered 2020-09-23: 500 mg via TOPICAL

## 2020-09-23 MED ORDER — LACTATED RINGERS IV SOLN: INTRAVENOUS | Status: DC

## 2020-09-23 MED ORDER — MIDAZOLAM HCL 2 MG/2ML IJ SOLN
2.0000 mg | Freq: Once | INTRAMUSCULAR | Status: AC
  Administered 2020-09-23: 2 mg via INTRAVENOUS

## 2020-09-23 MED ORDER — CEFAZOLIN SODIUM-DEXTROSE 2-4 GM/100ML-% IV SOLN
2.0000 g | INTRAVENOUS | Status: AC
Start: 2020-09-23 — End: 2020-09-23
  Administered 2020-09-23: 2 g via INTRAVENOUS

## 2020-09-23 MED ORDER — PROPOFOL 10 MG/ML IV BOLUS
INTRAVENOUS | Status: DC | PRN
  Administered 2020-09-23: 200 mg via INTRAVENOUS

## 2020-09-23 MED ORDER — HYDROMORPHONE HCL 1 MG/ML IJ SOLN: 0.2500 mg | INTRAMUSCULAR | Status: DC | PRN

## 2020-09-23 MED ORDER — OXYCODONE HCL 5 MG PO TABS: 5.0000 mg | ORAL_TABLET | ORAL | 0 refills | Status: AC | PRN

## 2020-09-23 MED ORDER — MIDAZOLAM HCL 2 MG/2ML IJ SOLN
INTRAMUSCULAR | Status: AC
  Filled 2020-09-23: qty 2

## 2020-09-23 MED ORDER — DEXAMETHASONE SODIUM PHOSPHATE 10 MG/ML IJ SOLN
INTRAMUSCULAR | Status: DC | PRN
  Administered 2020-09-23: 5 mg via INTRAVENOUS

## 2020-09-23 MED ORDER — MEPERIDINE HCL 25 MG/ML IJ SOLN: 6.2500 mg | INTRAMUSCULAR | Status: DC | PRN

## 2020-09-23 MED ORDER — ONDANSETRON HCL 4 MG/2ML IJ SOLN
INTRAMUSCULAR | Status: DC | PRN
  Administered 2020-09-23: 4 mg via INTRAVENOUS

## 2020-09-23 MED ORDER — FENTANYL CITRATE (PF) 100 MCG/2ML IJ SOLN
INTRAMUSCULAR | Status: AC
  Filled 2020-09-23: qty 2

## 2020-09-23 MED ORDER — FENTANYL CITRATE (PF) 100 MCG/2ML IJ SOLN
100.0000 ug | Freq: Once | INTRAMUSCULAR | Status: AC
  Administered 2020-09-23: 100 ug via INTRAVENOUS

## 2020-09-23 MED ORDER — SODIUM CHLORIDE 0.9 % IV SOLN: INTRAVENOUS | Status: DC

## 2020-09-23 MED ORDER — RIVAROXABAN 10 MG PO TABS: 10.0000 mg | ORAL_TABLET | Freq: Every day | ORAL | 0 refills | Status: DC

## 2020-09-23 MED ORDER — LIDOCAINE 2% (20 MG/ML) 5 ML SYRINGE
INTRAMUSCULAR | Status: AC
  Filled 2020-09-23: qty 5

## 2020-09-23 MED ORDER — PROPOFOL 10 MG/ML IV BOLUS
INTRAVENOUS | Status: AC
  Filled 2020-09-23: qty 20

## 2020-09-23 MED ORDER — OXYCODONE HCL 5 MG PO TABS: 5.0000 mg | ORAL_TABLET | Freq: Once | ORAL | Status: DC | PRN

## 2020-09-23 MED ORDER — AMISULPRIDE (ANTIEMETIC) 5 MG/2ML IV SOLN: 10.0000 mg | Freq: Once | INTRAVENOUS | Status: DC | PRN

## 2020-09-23 MED ORDER — CEFAZOLIN SODIUM-DEXTROSE 2-4 GM/100ML-% IV SOLN
INTRAVENOUS | Status: AC
  Filled 2020-09-23: qty 100

## 2020-09-23 MED ORDER — SENNA 8.6 MG PO TABS: 2.0000 | ORAL_TABLET | Freq: Two times a day (BID) | ORAL | 0 refills | Status: DC

## 2020-09-23 MED ORDER — OXYCODONE HCL 5 MG/5ML PO SOLN: 5.0000 mg | Freq: Once | ORAL | Status: DC | PRN

## 2020-09-23 MED ORDER — LIDOCAINE HCL (CARDIAC) PF 100 MG/5ML IV SOSY
PREFILLED_SYRINGE | INTRAVENOUS | Status: DC | PRN
  Administered 2020-09-23: 60 mg via INTRATRACHEAL

## 2020-09-23 SURGICAL SUPPLY — 80 items
BANDAGE ESMARK 6X9 LF (GAUZE/BANDAGES/DRESSINGS) IMPLANT
BIT DRILL 2.5X2.75 QC CALB (BIT) ×2 IMPLANT
BIT DRILL 2.9 CANN QC NONSTRL (BIT) ×2 IMPLANT
BIT DRILL 3.5X5.5 QC CALB (BIT) ×2 IMPLANT
BLADE SURG 15 STRL LF DISP TIS (BLADE) ×3 IMPLANT
BLADE SURG 15 STRL SS (BLADE) ×3
BNDG COHESIVE 4X5 TAN STRL (GAUZE/BANDAGES/DRESSINGS) ×2 IMPLANT
BNDG COHESIVE 6X5 TAN STRL LF (GAUZE/BANDAGES/DRESSINGS) ×2 IMPLANT
BNDG ESMARK 4X9 LF (GAUZE/BANDAGES/DRESSINGS) IMPLANT
BNDG ESMARK 6X9 LF (GAUZE/BANDAGES/DRESSINGS)
CANISTER SUCT 1200ML W/VALVE (MISCELLANEOUS) ×2 IMPLANT
CHLORAPREP W/TINT 26 (MISCELLANEOUS) ×2 IMPLANT
COVER BACK TABLE 60X90IN (DRAPES) ×2 IMPLANT
COVER WAND RF STERILE (DRAPES) IMPLANT
CUFF TOURN SGL QUICK 34 (TOURNIQUET CUFF) ×1
CUFF TOURN SGL QUICK 42 (TOURNIQUET CUFF) IMPLANT
CUFF TRNQT CYL 34X4.125X (TOURNIQUET CUFF) ×1 IMPLANT
DECANTER SPIKE VIAL GLASS SM (MISCELLANEOUS) IMPLANT
DRAPE EXTREMITY T 121X128X90 (DISPOSABLE) ×2 IMPLANT
DRAPE OEC MINIVIEW 54X84 (DRAPES) ×2 IMPLANT
DRAPE U-SHAPE 47X51 STRL (DRAPES) ×2 IMPLANT
DRSG MEPITEL 4X7.2 (GAUZE/BANDAGES/DRESSINGS) ×2 IMPLANT
DRSG PAD ABDOMINAL 8X10 ST (GAUZE/BANDAGES/DRESSINGS) ×4 IMPLANT
ELECT REM PT RETURN 9FT ADLT (ELECTROSURGICAL) ×2
ELECTRODE REM PT RTRN 9FT ADLT (ELECTROSURGICAL) ×1 IMPLANT
GAUZE SPONGE 4X4 12PLY STRL (GAUZE/BANDAGES/DRESSINGS) ×2 IMPLANT
GLOVE SRG 8 PF TXTR STRL LF DI (GLOVE) ×2 IMPLANT
GLOVE SURG ENC MOIS LTX SZ7 (GLOVE) ×2 IMPLANT
GLOVE SURG ENC MOIS LTX SZ8 (GLOVE) ×2 IMPLANT
GLOVE SURG LTX SZ8 (GLOVE) ×2 IMPLANT
GLOVE SURG POLYISO LF SZ7 (GLOVE) ×2 IMPLANT
GLOVE SURG UNDER POLY LF SZ6.5 (GLOVE) ×2 IMPLANT
GLOVE SURG UNDER POLY LF SZ7 (GLOVE) ×4 IMPLANT
GLOVE SURG UNDER POLY LF SZ8 (GLOVE) ×2
GOWN STRL REUS W/ TWL LRG LVL3 (GOWN DISPOSABLE) ×2 IMPLANT
GOWN STRL REUS W/ TWL XL LVL3 (GOWN DISPOSABLE) ×2 IMPLANT
GOWN STRL REUS W/TWL LRG LVL3 (GOWN DISPOSABLE) ×2
GOWN STRL REUS W/TWL XL LVL3 (GOWN DISPOSABLE) ×2
K-WIRE ACE 1.6X6 (WIRE) ×4
KWIRE ACE 1.6X6 (WIRE) ×2 IMPLANT
NEEDLE HYPO 22GX1.5 SAFETY (NEEDLE) IMPLANT
NS IRRIG 1000ML POUR BTL (IV SOLUTION) ×2 IMPLANT
PACK BASIN DAY SURGERY FS (CUSTOM PROCEDURE TRAY) ×2 IMPLANT
PAD CAST 4YDX4 CTTN HI CHSV (CAST SUPPLIES) ×1 IMPLANT
PADDING CAST ABS 4INX4YD NS (CAST SUPPLIES)
PADDING CAST ABS COTTON 4X4 ST (CAST SUPPLIES) IMPLANT
PADDING CAST COTTON 4X4 STRL (CAST SUPPLIES) ×1
PADDING CAST COTTON 6X4 STRL (CAST SUPPLIES) ×2 IMPLANT
PENCIL SMOKE EVACUATOR (MISCELLANEOUS) ×2 IMPLANT
PLATE ACE 100DEG 7HOLE (Plate) ×2 IMPLANT
SANITIZER HAND PURELL 535ML FO (MISCELLANEOUS) ×2 IMPLANT
SCREW ACE CAN 4.0 40M (Screw) ×4 IMPLANT
SCREW CORTICAL 3.5MM  16MM (Screw) ×4 IMPLANT
SCREW CORTICAL 3.5MM  20MM (Screw) ×1 IMPLANT
SCREW CORTICAL 3.5MM 16MM (Screw) ×4 IMPLANT
SCREW CORTICAL 3.5MM 18MM (Screw) ×2 IMPLANT
SCREW CORTICAL 3.5MM 20MM (Screw) ×1 IMPLANT
SCREW CORTICAL 3.5MM 26MM (Screw) ×2 IMPLANT
SCREW NLOCK CANC HEX 4X40 (Screw) ×2 IMPLANT
SHEET MEDIUM DRAPE 40X70 STRL (DRAPES) ×2 IMPLANT
SLEEVE SCD COMPRESS KNEE MED (STOCKING) ×2 IMPLANT
SPLINT FAST PLASTER 5X30 (CAST SUPPLIES) ×20
SPLINT PLASTER CAST FAST 5X30 (CAST SUPPLIES) ×20 IMPLANT
SPONGE LAP 18X18 RF (DISPOSABLE) ×2 IMPLANT
STOCKINETTE 6  STRL (DRAPES) ×1
STOCKINETTE 6 STRL (DRAPES) ×1 IMPLANT
SUCTION FRAZIER HANDLE 10FR (MISCELLANEOUS) ×1
SUCTION TUBE FRAZIER 10FR DISP (MISCELLANEOUS) ×1 IMPLANT
SUT ETHILON 3 0 PS 1 (SUTURE) ×2 IMPLANT
SUT FIBERWIRE #2 38 T-5 BLUE (SUTURE)
SUT MNCRL AB 3-0 PS2 18 (SUTURE) IMPLANT
SUT VIC AB 0 SH 27 (SUTURE) IMPLANT
SUT VIC AB 2-0 SH 27 (SUTURE) ×1
SUT VIC AB 2-0 SH 27XBRD (SUTURE) ×1 IMPLANT
SUTURE FIBERWR #2 38 T-5 BLUE (SUTURE) IMPLANT
SYR BULB EAR ULCER 3OZ GRN STR (SYRINGE) ×2 IMPLANT
SYR CONTROL 10ML LL (SYRINGE) IMPLANT
TOWEL GREEN STERILE FF (TOWEL DISPOSABLE) ×4 IMPLANT
TUBE CONNECTING 20X1/4 (TUBING) ×2 IMPLANT
UNDERPAD 30X36 HEAVY ABSORB (UNDERPADS AND DIAPERS) ×2 IMPLANT

## 2020-09-23 NOTE — Anesthesia Procedure Notes (Signed)
Anesthesia Regional Block: Adductor canal block   Pre-Anesthetic Checklist: ,, timeout performed, Correct Patient, Correct Site, Correct Laterality, Correct Procedure, Correct Position, site marked, Risks and benefits discussed,  Surgical consent,  Pre-op evaluation,  At surgeon's request and post-op pain management  Laterality: Right  Prep: chloraprep       Needles:  Injection technique: Single-shot  Needle Type: Stimiplex     Needle Length: 9cm  Needle Gauge: 21     Additional Needles:   Procedures:,,,, ultrasound used (permanent image in chart),,,,  Narrative:  Start time: 09/23/2020 7:14 AM End time: 09/23/2020 7:19 AM Injection made incrementally with aspirations every 5 mL.  Performed by: Personally  Anesthesiologist: Lynda Rainwater, MD

## 2020-09-23 NOTE — Transfer of Care (Signed)
Immediate Anesthesia Transfer of Care Note  Patient: Kelly Montoya  Procedure(s) Performed: Open Reduction Internal Fixation (ORIF) Right Trimalleolar Fracture (Right Ankle)  Patient Location: PACU  Anesthesia Type:General  Level of Consciousness: drowsy, patient cooperative and responds to stimulation  Airway & Oxygen Therapy: Patient Spontanous Breathing and Patient connected to face mask oxygen  Post-op Assessment: Report given to RN and Post -op Vital signs reviewed and stable  Post vital signs: Reviewed and stable  Last Vitals:  Vitals Value Taken Time  BP    Temp    Pulse 105 09/23/20 0857  Resp 12 09/23/20 0857  SpO2 98 % 09/23/20 0857  Vitals shown include unvalidated device data.  Last Pain:  Vitals:   09/23/20 0642  TempSrc: Oral  PainSc: 0-No pain         Complications: No complications documented.

## 2020-09-23 NOTE — Anesthesia Procedure Notes (Signed)
Procedure Name: LMA Insertion Date/Time: 09/23/2020 7:37 AM Performed by: Glory Buff, CRNA Pre-anesthesia Checklist: Patient identified, Emergency Drugs available, Suction available and Patient being monitored Patient Re-evaluated:Patient Re-evaluated prior to induction Oxygen Delivery Method: Circle system utilized Preoxygenation: Pre-oxygenation with 100% oxygen Induction Type: IV induction LMA: LMA inserted LMA Size: 4.0 Number of attempts: 1 Placement Confirmation: positive ETCO2 Tube secured with: Tape Dental Injury: Teeth and Oropharynx as per pre-operative assessment

## 2020-09-23 NOTE — Anesthesia Postprocedure Evaluation (Signed)
Anesthesia Post Note  Patient: KLARISA BARMAN  Procedure(s) Performed: Open Reduction Internal Fixation (ORIF) Right Trimalleolar Fracture (Right Ankle)     Patient location during evaluation: PACU Anesthesia Type: General Level of consciousness: awake and alert Pain management: pain level controlled Vital Signs Assessment: post-procedure vital signs reviewed and stable Respiratory status: spontaneous breathing, nonlabored ventilation and respiratory function stable Cardiovascular status: blood pressure returned to baseline and stable Postop Assessment: no apparent nausea or vomiting Anesthetic complications: no   No complications documented.  Last Vitals:  Vitals:   09/23/20 0911 09/23/20 0922  BP: 115/71 127/75  Pulse: 100 (!) 102  Resp: 18 15  Temp:  36.9 C  SpO2: 99% 99%    Last Pain:  Vitals:   09/23/20 0922  TempSrc:   PainSc: 0-No pain                 Lynda Rainwater

## 2020-09-23 NOTE — Anesthesia Procedure Notes (Signed)
Anesthesia Regional Block: Popliteal block   Pre-Anesthetic Checklist: ,, timeout performed, Correct Patient, Correct Site, Correct Laterality, Correct Procedure, Correct Position, site marked, Risks and benefits discussed,  Surgical consent,  Pre-op evaluation,  At surgeon's request and post-op pain management  Laterality: Right  Prep: chloraprep       Needles:  Injection technique: Single-shot  Needle Type: Stimiplex     Needle Length: 9cm  Needle Gauge: 21     Additional Needles:   Procedures:,,,, ultrasound used (permanent image in chart),,,,  Narrative:  Start time: 09/23/2020 7:15 AM End time: 09/23/2020 7:20 AM Injection made incrementally with aspirations every 5 mL.  Performed by: Personally  Anesthesiologist: Lynda Rainwater, MD

## 2020-09-23 NOTE — Discharge Instructions (Addendum)
Wylene Simmer, MD [facility]  Please read the following information regarding your care after surgery.  Medications  You only need a prescription for the narcotic pain medicine (ex. oxycodone, Percocet, Norco).  All of the other medicines listed below are available over the counter. X Naproxen that you have at home, as prescribed, for the first 3 days after surgery. X acetominophen (Tylenol) 650 mg every 4-6 hours as you need for minor to moderate pain X oxycodone as prescribed for severe pain  Narcotic pain medicine (ex. oxycodone, Percocet, Vicodin) will cause constipation.  To prevent this problem, take the following medicines while you are taking any pain medicine. X docusate sodium (Colace) 100 mg twice a day X senna (Senokot) 2 tablets twice a day  X To help prevent blood clots, take Xarelto as prescribed for two weeks after surgery.  You should also get up every hour while you are awake to move around.    Weight Bearing X Do not bear any weight on the operated leg or foot.  Cast / Splint / Dressing X Keep your splint, cast or dressing clean and dry.  Dont put anything (coat hanger, pencil, etc) down inside of it.  If it gets damp, use a hair dryer on the cool setting to dry it.  If it gets soaked, call the office to schedule an appointment for a cast change.   After your dressing, cast or splint is removed; you may shower, but do not soak or scrub the wound.  Allow the water to run over it, and then gently pat it dry.  Swelling It is normal for you to have swelling where you had surgery.  To reduce swelling and pain, keep your toes above your nose for at least 3 days after surgery.  It may be necessary to keep your foot or leg elevated for several weeks.  If it hurts, it should be elevated.  Follow Up Call my office at [phone] when you are discharged from the hospital or surgery center to schedule an appointment to be seen two weeks after surgery.  Call my office at  [phone] if you develop a fever >101.5 F, nausea, vomiting, bleeding from the surgical site or severe pain.      Post Anesthesia Home Care Instructions  Activity: Get plenty of rest for the remainder of the day. A responsible individual must stay with you for 24 hours following the procedure.  For the next 24 hours, DO NOT: -Drive a car -Paediatric nurse -Drink alcoholic beverages -Take any medication unless instructed by your physician -Make any legal decisions or sign important papers.  Meals: Start with liquid foods such as gelatin or soup. Progress to regular foods as tolerated. Avoid greasy, spicy, heavy foods. If nausea and/or vomiting occur, drink only clear liquids until the nausea and/or vomiting subsides. Call your physician if vomiting continues.  Special Instructions/Symptoms: Your throat may feel dry or sore from the anesthesia or the breathing tube placed in your throat during surgery. If this causes discomfort, gargle with warm salt water. The discomfort should disappear within 24 hours.  If you had a scopolamine patch placed behind your ear for the management of post- operative nausea and/or vomiting:  1. The medication in the patch is effective for 72 hours, after which it should be removed.  Wrap patch in a tissue and discard in the trash. Wash hands thoroughly with soap and water. 2. You may remove the patch earlier than 72 hours if you experience unpleasant side  effects which may include dry mouth, dizziness or visual disturbances. 3. Avoid touching the patch. Wash your hands with soap and water after contact with the patch.    Regional Anesthesia Blocks  1. Numbness or the inability to move the "blocked" extremity may last from 3-48 hours after placement. The length of time depends on the medication injected and your individual response to the medication. If the numbness is not going away after 48 hours, call your surgeon.  2. The extremity that is blocked  will need to be protected until the numbness is gone and the  Strength has returned. Because you cannot feel it, you will need to take extra care to avoid injury. Because it may be weak, you may have difficulty moving it or using it. You may not know what position it is in without looking at it while the block is in effect.  3. For blocks in the legs and feet, returning to weight bearing and walking needs to be done carefully. You will need to wait until the numbness is entirely gone and the strength has returned. You should be able to move your leg and foot normally before you try and bear weight or walk. You will need someone to be with you when you first try to ensure you do not fall and possibly risk injury.  4. Bruising and tenderness at the needle site are common side effects and will resolve in a few days.  5. Persistent numbness or new problems with movement should be communicated to the surgeon or the [facility] [phone] [place] [phone]).

## 2020-09-23 NOTE — H&P (Signed)
Kelly Montoya is an 30 y.o. female.   Chief Complaint:  Right ankle injury HPI:  30 y/o female without significant PMH fell last week injuring her right ankle.  She has a trimal ankle fracture and presents now for ORIF.  Past Medical History:  Diagnosis Date  . Allergy   . Anemia   . Anxiety   . Breast cyst   . Depression   . Family history of breast cancer    My Risk neg 2/17, NBN VUS  . Family history of ovarian cancer   . Increased risk of breast cancer    IBIS Lifetime risk 25.4%; MyRisk neg  . Neuromuscular disorder [facility])     Past Surgical History:  Procedure Laterality Date  . MANDIBLE SURGERY Bilateral 01/012011   nerve damage with numbness on the chin on the right side.    Family History  Problem Relation Age of Onset  . Breast cancer Maternal Aunt 85  . Breast cancer Maternal Grandmother 29  . Cerebral aneurysm Maternal Grandmother   . Breast cancer Maternal Grandfather 80  . Ovarian cancer Maternal Grandfather   . Stroke Maternal Grandfather   . Macular degeneration Maternal Grandfather   . Ovarian cancer Paternal Grandmother 29  . Breast cancer Cousin        maternal 2nd cousin  . Breast cancer Maternal Uncle   . Parkinson's disease Paternal Aunt    Social History:  reports that she has never smoked. She has never used smokeless tobacco. She reports current alcohol use of about 1.0 standard drink of alcohol per week. She reports that she does not use drugs.  Allergies: No Known Allergies  Medications Prior to Admission  Medication Sig Dispense Refill  . cholecalciferol (VITAMIN D) 1000 units tablet Take 1,000 Units by mouth daily.    . cyclobenzaprine (FLEXERIL) 5 MG tablet Take 5 mg by mouth 3 (three) times daily as needed for muscle spasms.    Marland Kitchen etonogestrel-ethinyl estradiol (NUVARING) 0.12-0.015 MG/24HR vaginal ring Place 1 each vaginally every 28 (twenty-eight) days. Insert vaginally and leave in place for 3 consecutive weeks, then remove for 1  week.    . naproxen sodium (ALEVE) 220 MG tablet Take 220 mg by mouth. One in the morning and one in the evening.    Marland Kitchen oxyCODONE-acetaminophen (PERCOCET/ROXICET) 5-325 MG tablet Take 1-2 tablets by mouth every 6 (six) hours as needed for severe pain. 20 tablet 0  . sertraline (ZOLOFT) 50 MG tablet Take 75 mg by mouth daily.      Results for orders placed or performed during the hospital encounter of 09/23/20 (from the past 48 hour(s))  Pregnancy, urine POC     Status: None   Collection Time: 09/23/20  6:36 AM  Result Value Ref Range   Preg Test, Ur NEGATIVE NEGATIVE    Comment:        THE SENSITIVITY OF THIS METHODOLOGY IS >24 mIU/mL    No results found.  Review of Systems  No recent f/c/n/v/wt loss  Blood pressure 114/85, pulse (!) 110, temperature 98.1 F (36.7 C), temperature source Oral, resp. rate (!) 23, height 5\' 8"  (1.727 m), weight 98 kg, last menstrual period 08/18/2020, SpO2 99 %. Physical Exam  wn wd female in  [place].  A and O x 4. Normal mood anda ffect.  EOMI.  resp unlabored.  R ankle with healthy skin and moderate swelling.  NVI.  Assessment/Plan R ankle trimal fracture - to OR today for ORIF.  The risks  and benefits of the alternative treatment options have been discussed in detail.  The patient wishes to proceed with surgery and specifically understands risks of bleeding, infection, nerve damage, blood clots, need for additional surgery, amputation and death.   Wylene Simmer, MD 17-Oct-2020, 7:26 AM

## 2020-09-23 NOTE — Progress Notes (Signed)
Assisted Dr. Sabra Heck with right, ultrasound guided, popliteal, adductor canal block. Side rails up, monitors on throughout procedure. See vital signs in flow sheet. Tolerated Procedure well.

## 2020-09-23 NOTE — Anesthesia Preprocedure Evaluation (Addendum)
Anesthesia Evaluation  Patient identified by MRN, date of birth, ID band Patient awake    Reviewed: Allergy & Precautions, NPO status , Patient's Chart, lab work & pertinent test results  Airway Mallampati: II  TM Distance: >3 FB Neck ROM: Full    Dental no notable dental hx.    Pulmonary neg pulmonary ROS,    Pulmonary exam normal breath sounds clear to auscultation       Cardiovascular negative cardio ROS Normal cardiovascular exam Rhythm:Regular Rate:Normal     Neuro/Psych Anxiety Depression negative neurological ROS  negative psych ROS   GI/Hepatic negative GI ROS, Neg liver ROS,   Endo/Other  negative endocrine ROS  Renal/GU negative Renal ROS  negative genitourinary   Musculoskeletal negative musculoskeletal ROS (+)   Abdominal (+) + obese,   Peds negative pediatric ROS (+)  Hematology negative hematology ROS (+)   Anesthesia Other Findings   Reproductive/Obstetrics negative OB ROS                             Anesthesia Physical Anesthesia Plan  ASA: II  Anesthesia Plan: General   Post-op Pain Management:  Regional for Post-op pain   Induction: Intravenous  PONV Risk Score and Plan: 3 and Ondansetron, Dexamethasone, Midazolam and Treatment may vary due to age or medical condition  Airway Management Planned: LMA  Additional Equipment:   Intra-op Plan:   Post-operative Plan: Extubation in OR  Informed Consent: I have reviewed the patients History and Physical, chart, labs and discussed the procedure including the risks, benefits and alternatives for the proposed anesthesia with the patient or authorized representative who has indicated his/her understanding and acceptance.     Dental advisory given  Plan Discussed with: CRNA  Anesthesia Plan Comments:         Anesthesia Quick Evaluation

## 2020-09-23 NOTE — Op Note (Signed)
09/23/2020  8:52 AM  PATIENT:  Kelly Montoya  30 y.o. female  PRE-OPERATIVE DIAGNOSIS:  Right ankle trimalleolar fracture  POST-OPERATIVE DIAGNOSIS:  Right ankle trimalleolar fracture  Procedure(s): 1.  Open treatment of right ankle trimalleolar fracture with internal fixation including fixation of the posterior malleolus 2.  Stress examination of the right ankle under fluoroscopy 3.  AP, mortise and lateral radiographs of the right ankle  SURGEON:  Wylene Simmer, MD  ASSISTANT: Mechele Claude, PA-C  ANESTHESIA:   General, regional  EBL:  minimal   TOURNIQUET:   Total Tourniquet Time Documented: Thigh (Right) - 57 minutes Total: Thigh (Right) - 57 minutes  COMPLICATIONS:  None apparent  DISPOSITION:  Extubated, awake and stable to recovery.  INDICATION FOR PROCEDURE: The patient is a 30 year old female who injured her right ankle over a week ago.  She rolled her ankle off a curb and sustained a trimalleolar fracture.  She presents now for operative treatment of this displaced and unstable right ankle injury.  The risks and benefits of the alternative treatment options have been discussed in detail.  The patient wishes to proceed with surgery and specifically understands risks of bleeding, infection, nerve damage, blood clots, need for additional surgery, amputation and death.  PROCEDURE IN DETAIL:  After pre operative consent was obtained, and the correct operative site was identified, the patient was brought to the operating room and placed supine on the OR table.  Anesthesia was administered.  Pre-operative antibiotics were administered.  A surgical timeout was taken.  The right lower extremity was prepped and draped in standard sterile fashion with a tourniquet around the thigh.  The extremity was elevated and the tourniquet was inflated to 250 mmHg.  A longitudinal incision was made over the lateral malleolus.  Dissection was carried sharply down through the subcutaneous tissues  to the fracture site.  Fracture was cleaned of all hematoma and periosteum.  It was irrigated copiously.  The fracture was opened with a lamina spreader allowing exposure of the posterior malleolus fracture.  It was also cleaned of all hematoma and irrigated.  The ankle was then reduced and the lateral malleolus fracture held with a lobster claw and a pointed tenaculum.  A lateral radiograph showed appropriate reduction of the posterior malleolus fracture and the lateral malleolus fracture.  A Weber tenaculum was used to compress the posterior malleolus fracture through a stab incision anteriorly.  Care was taken to protect the neurovascular bundle.  A K wire was then advanced from anterior to posterior across the fracture site.  It was overdrilled and a 4 mm x 40 mm partially-threaded cannulated screw from the [employer] small frag set was inserted.  It was noted to have excellent purchase and held the fracture appropriately compressed.  A 3.5 mm fully threaded lag screw was inserted from anterior to posterior across the fracture site.  It was noted to have excellent purchase and compressed the fracture site appropriately.  A 7 hole one third tubular plate was contoured to fit the lateral malleolus.  It was secured distally with 3 unicortical screws and proximally with 3 bicortical screws.  Radiographs confirmed appropriate reduction of the lateral and posterior malleolus fractures in appropriate position and length of the hardware.  Attention was turned to the medial ankle where an incision was made over the medial malleolus.  Dissection was carried sharply down through the subcutaneous tissues to the fracture site.  The fracture was irrigated and cleaned of all hematoma and nonviable bone  fragments.  The fracture was reduced and held with a pointed tenaculum.  AP, mortise and lateral radiographs confirmed appropriate reduction of the medial malleolus fracture.  2 K wires were inserted.  The posterior K  wire was overdrilled.  A 4 mm partially-threaded cannulated screw was inserted.  It was noted to compress the fracture site appropriately.  The anterior K wire was overdrilled and removed.  A fully threaded solid 4 mm screw was inserted as a position screw due to the anterior comminution.  Final AP, mortise and lateral radiographs confirmed appropriate position and length of all hardware and appropriate reduction of the medial, lateral and posterior malleolus fractures.  Stress examination was then performed.  Dorsiflexion and external rotation stress was applied to the supinated forefoot.  A mortise view was obtained.  No widening of the medial clear space or decrease in the tib-fib overlap was noted.  Both wounds were irrigated copiously and sprinkled with vancomycin powder.  Subcutaneous tissues were approximated with Vicryl.  Skin incisions were closed with nylon.  Sterile dressings were applied followed by well-padded short leg splint.  The tourniquet was released after application of the dressings.  The patient was awakened from anesthesia and transported to the recovery room in stable condition.  FOLLOW UP PLAN:  Nonweightbearing on the right lower extremity.  Xarelto for DVT prophylaxis since she uses contraceptives.  Follow-up in 2 weeks for suture removal and conversion to a short leg cast.  Plan 6 weeks postoperative nonweightbearing immobilization.   RADIOGRAPHS: AP, mortise and lateral radiographs of the right ankle were obtained intraoperatively.  These show interval reduction and fixation of the trimalleolar right ankle fracture.  Hardware is appropriately positioned and of the appropriate lengths.  No other acute injuries are noted.    Mechele Claude PA-C was present and scrubbed for the duration of the operative case. His assistance was essential in positioning the patient, prepping and draping, gaining and maintaining exposure, performing the operation, closing and dressing the wounds and  applying the splint.

## 2020-09-24 ENCOUNTER — Encounter (HOSPITAL_BASED_OUTPATIENT_CLINIC_OR_DEPARTMENT_OTHER): Payer: Self-pay | Admitting: Orthopedic Surgery

## 2020-12-14 DIAGNOSIS — M25571 Pain in right ankle and joints of right foot: Secondary | ICD-10-CM | POA: Insufficient documentation

## 2021-03-30 ENCOUNTER — Encounter (HOSPITAL_COMMUNITY): Payer: Self-pay

## 2021-03-30 ENCOUNTER — Emergency Department (HOSPITAL_COMMUNITY)
Admission: EM | Admit: 2021-03-30 | Discharge: 2021-03-30 | Disposition: A | Discharge status: Home / Self Care | Payer: BC Managed Care – PPO | Attending: Student | Admitting: Student

## 2021-03-30 ENCOUNTER — Emergency Department (HOSPITAL_COMMUNITY): Payer: BC Managed Care – PPO

## 2021-03-30 ENCOUNTER — Other Ambulatory Visit: Payer: Self-pay

## 2021-03-30 DIAGNOSIS — R0981 Nasal congestion: Secondary | ICD-10-CM | POA: Diagnosis not present

## 2021-03-30 DIAGNOSIS — Z7901 Long term (current) use of anticoagulants: Secondary | ICD-10-CM | POA: Diagnosis not present

## 2021-03-30 DIAGNOSIS — R0602 Shortness of breath: Secondary | ICD-10-CM | POA: Insufficient documentation

## 2021-03-30 DIAGNOSIS — J029 Acute pharyngitis, unspecified: Secondary | ICD-10-CM | POA: Diagnosis not present

## 2021-03-30 DIAGNOSIS — R079 Chest pain, unspecified: Secondary | ICD-10-CM | POA: Insufficient documentation

## 2021-03-30 DIAGNOSIS — R059 Cough, unspecified: Secondary | ICD-10-CM | POA: Diagnosis not present

## 2021-03-30 LAB — HCG, SERUM, QUALITATIVE: Preg, Serum: NEGATIVE

## 2021-03-30 LAB — BASIC METABOLIC PANEL
Anion gap: 10 (ref 5–15)
BUN: 8 mg/dL (ref 6–20)
CO2: 22 mmol/L (ref 22–32)
Calcium: 9.2 mg/dL (ref 8.9–10.3)
Chloride: 106 mmol/L (ref 98–111)
Creatinine, Ser: 0.8 mg/dL (ref 0.44–1.00)
GFR, Estimated: >60 mL/min (ref >60)
Glucose, Bld: 91 mg/dL (ref 70–99)
Potassium: 3.6 mmol/L (ref 3.5–5.1)
Sodium: 138 mmol/L (ref 135–145)

## 2021-03-30 LAB — CBC WITH DIFFERENTIAL/PLATELET
Abs Immature Granulocytes: 0.02 10*3/uL (ref 0.00–0.07)
Basophils Absolute: 0 10*3/uL (ref 0.0–0.1)
Basophils Relative: 0 %
Eosinophils Absolute: 0.2 10*3/uL (ref 0.0–0.5)
Eosinophils Relative: 2 %
HCT: 40.4 % (ref 36.0–46.0)
Hemoglobin: 12.9 g/dL (ref 12.0–15.0)
Immature Granulocytes: 0 %
Lymphocytes Relative: 22 %
Lymphs Abs: 2 10*3/uL (ref 0.7–4.0)
MCH: 26.6 pg (ref 26.0–34.0)
MCHC: 31.9 g/dL (ref 30.0–36.0)
MCV: 83.3 fL (ref 80.0–100.0)
Monocytes Absolute: 0.6 10*3/uL (ref 0.1–1.0)
Monocytes Relative: 7 %
Neutro Abs: 6.4 10*3/uL (ref 1.7–7.7)
Neutrophils Relative %: 69 %
Platelets: 255 10*3/uL (ref 150–400)
RBC: 4.85 MIL/uL (ref 3.87–5.11)
RDW: 15.2 % (ref 11.5–15.5)
WBC: 9.3 10*3/uL (ref 4.0–10.5)
nRBC: 0 % (ref 0.0–0.2)

## 2021-03-30 LAB — TROPONIN I (HIGH SENSITIVITY)
Troponin I (High Sensitivity): <2 ng/L (ref <18)
Troponin I (High Sensitivity): <2 ng/L (ref <18)

## 2021-03-30 LAB — BRAIN NATRIURETIC PEPTIDE: B Natriuretic Peptide: 34 pg/mL (ref 0.0–100.0)

## 2021-03-30 MED ORDER — IOHEXOL 350 MG/ML SOLN
80.0000 mL | Freq: Once | INTRAVENOUS | Status: AC | PRN
  Administered 2021-03-30: 80 mL via INTRAVENOUS

## 2021-03-30 NOTE — ED Provider Notes (Signed)
[facility] DEPT Provider Note   CSN: [id number] Arrival date & time: 03/30/21  1136     History Chief Complaint  Patient presents with   Abnormal Lab    Kelly Montoya is a 30 y.o. female who presents to the emergency department for further evaluation of cough, congestion, and shortness of breath that began 3 days ago.  She was seen evaluated urgent care prior to arrival where she had a full work-up.  D-dimer was found to be elevated notified to come the emergency department for further evaluation.  Shortness of breath is worse with exertion.  Reports associated chest pain characterized as a heavy sensation and sore throat.  Denies any fever, chills, abdominal pain, nausea, vomiting, diarrhea, leg pain, leg swelling, and syncope.  She did take Mucinex for the cough which offered slight improvement.  She does state that she took a car trip to [place] recently.  She is on birth control with the NuvaRing.  Took Plan B over the weekend.  The history is provided by the patient and medical records. No language interpreter was used.  Abnormal Lab     Past Medical History:  Diagnosis Date   Allergy    Anemia    Anxiety    Breast cyst    Depression    Family history of breast cancer    My Risk neg 2/17, NBN VUS   Family history of ovarian cancer    Increased risk of breast cancer    IBIS Lifetime risk 25.4%; MyRisk neg   Neuromuscular disorder ([place])     There are no problems to display for this patient.   Past Surgical History:  Procedure Laterality Date   MANDIBLE SURGERY Bilateral 01/012011   nerve damage with numbness on the chin on the right side.   ORIF ANKLE FRACTURE Right 09/23/2020   Procedure: Open Reduction Internal Fixation (ORIF) Right Trimalleolar Fracture;  Surgeon: Wylene Simmer, MD;  Location: [facility];  Service: Orthopedics;  Laterality: Right;     OB History     Gravida  0   Para  0   Term  0    Preterm  0   AB  0   Living  0      SAB  0   IAB  0   Ectopic  0   Multiple  0   Live Births  0           Family History  Problem Relation Age of Onset   Breast cancer Maternal Aunt 85   Breast cancer Maternal Grandmother 70   Cerebral aneurysm Maternal Grandmother    Breast cancer Maternal Grandfather 55   Ovarian cancer Maternal Grandfather    Stroke Maternal Grandfather    Macular degeneration Maternal Grandfather    Ovarian cancer Paternal Grandmother 7   Breast cancer Cousin        maternal 2nd cousin   Breast cancer Maternal Uncle    Parkinson's disease Paternal Aunt     Social History   Tobacco Use   Smoking status: Never   Smokeless tobacco: Never  Vaping Use   Vaping Use: Never used  Substance Use Topics   Alcohol use: Yes    Alcohol/week: 1.0 standard drink    Types: 1 Glasses of wine per week    Comment: socially   Drug use: No    Home Medications Prior to Admission medications   Medication Sig Start Date End Date  Taking? Authorizing Provider  cholecalciferol (VITAMIN D) 1000 units tablet Take 1,000 Units by mouth daily.   Yes [provider]  etonogestrel-ethinyl estradiol (NUVARING) 0.12-0.015 MG/24HR vaginal ring Place 1 each vaginally every 28 (twenty-eight) days. Insert vaginally and leave in place for 3 consecutive weeks, then remove for 1 week.   Yes [provider]  guaiFENesin (MUCINEX) 600 MG 12 hr tablet Take 1,200 mg by mouth daily as needed for cough.   Yes [provider]  sertraline (ZOLOFT) 50 MG tablet Take 75 mg by mouth daily.   Yes [provider]  docusate sodium (COLACE) 100 MG capsule Take 1 capsule (100 mg total) by mouth 2 (two) times daily. While taking narcotic pain medicine. Patient not taking: No sig reported 09/23/20   Corky Sing, PA-C  rivaroxaban (XARELTO) 10 MG TABS tablet Take 1 tablet (10 mg total) by mouth daily. Patient not taking: Reported on 03/30/2021 09/23/20    Corky Sing, PA-C  senna (SENOKOT) 8.6 MG TABS tablet Take 2 tablets (17.2 mg total) by mouth 2 (two) times daily. Patient not taking: No sig reported 09/23/20   Corky Sing, PA-C    Allergies    Patient has no known allergies.  Review of Systems   Review of Systems  All other systems reviewed and are negative.  Physical Exam Updated Vital Signs BP 115/90   Pulse 77   Temp 98.5 F (36.9 C)   Resp 16   Ht 5\' 8"  (1.727 m)   Wt 98 kg   LMP 03/12/2021 (Exact Date)   SpO2 98%   BMI 32.85 kg/m   Physical Exam Constitutional:      General: She is not in acute distress.    Appearance: Normal appearance.  HENT:     Head: Normocephalic and atraumatic.  Eyes:     General:        Right eye: No discharge.        Left eye: No discharge.  Cardiovascular:     Comments: Regular rate and rhythm.  S1/S2 are distinct without any evidence of murmur, rubs, or gallops.  Radial pulses are 2+ bilaterally.  Dorsalis pedis pulses are 2+ bilaterally.  No evidence of pedal edema. Pulmonary:     Comments: Clear to auscultation bilaterally.  Normal effort.  No respiratory distress.  No evidence of wheezes, rales, or rhonchi heard throughout. Abdominal:     General: Abdomen is flat. Bowel sounds are normal. There is no distension.     Tenderness: There is no abdominal tenderness. There is no guarding or rebound.  Musculoskeletal:        General: Normal range of motion.     Cervical back: Neck supple.  Skin:    General: Skin is warm and dry.     Findings: No rash.  Neurological:     General: No focal deficit present.     Mental Status: She is alert.  Psychiatric:        Mood and Affect: Mood normal.        Behavior: Behavior normal.    ED Results / Procedures / Treatments   Labs (all labs ordered are listed, but only abnormal results are displayed) Labs Reviewed  CBC WITH DIFFERENTIAL/PLATELET  BASIC METABOLIC PANEL  HCG, SERUM, QUALITATIVE  BRAIN NATRIURETIC PEPTIDE   TROPONIN I (HIGH SENSITIVITY)  TROPONIN I (HIGH SENSITIVITY)    EKG None  Radiology CT Angio Chest PE W and/or Wo Contrast  Result Date: 03/30/2021 CLINICAL DATA:  Productive cough, congestion, positive D-dimer EXAM: CT ANGIOGRAPHY CHEST WITH CONTRAST TECHNIQUE: Multidetector CT imaging of the chest was performed using the standard protocol during bolus administration of intravenous contrast. Multiplanar CT image reconstructions and MIPs were obtained to evaluate the vascular anatomy. CONTRAST:  18mL OMNIPAQUE IOHEXOL 350 MG/ML SOLN COMPARISON:  None. FINDINGS: Cardiovascular: pulmonary arteries are normal in caliber and appear patent. No significant central or proximal hilar acute filling defect or pulmonary embolus. Limited assessment of the smaller segmental branches. Intact thoracic aorta. Negative for aneurysm or dissection. Patent 3 vessel arch anatomy. Normal heart size.  No pericardial effusion. Central venous structures are patent.  No veno-occlusive process. Mediastinum/Nodes: No enlarged mediastinal, hilar, or axillary lymph nodes. Thyroid gland, trachea, and esophagus demonstrate no significant findings. Lungs/Pleura: Lungs are clear. No pleural effusion or pneumothorax. Upper Abdomen: No acute abnormality. Musculoskeletal: No chest wall abnormality. No acute or significant osseous findings. Review of the MIP images confirms the above findings. IMPRESSION: Negative for significant acute pulmonary embolus by CTA. Limited assessment of the smaller peripheral segmental branches. No other acute intrathoracic finding. Electronically Signed   By: Jerilynn Mages.  Shick M.D.   On: 03/30/2021 16:48    Procedures Procedures   Medications Ordered in ED Medications  iohexol (OMNIPAQUE) 350 MG/ML injection 80 mL (80 mLs Intravenous Contrast Given 03/30/21 1621)    ED Course  I have reviewed the triage vital signs and the nursing notes.  Pertinent labs & imaging results that were available during my care  of the patient were reviewed by me and considered in my medical decision making (see chart for details).    MDM Rules/Calculators/A&P                          JAMAIRA SHERK is a 31 y.o. female who presents to the emergency department for further evaluation of cough, congestion, and shortness of breath.  Her presentation is likely secondary to upper respiratory infection.  Presentation is not consistent with any acute cardiac etiologies at this time.  Given the elevated D-dimer prior to arrival CT angio was ordered which was negative for pulmonary embolism.  The rest of her work-up was unremarkable.  Low suspicion for pneumothorax, allergic etiology, or sepsis at this time.  She is hemodynamically stable and safe for discharge.  Strict return precautions given.  I will have her follow-up with her primary care provider for reevaluation within the next week.  Final Clinical Impression(s) / ED Diagnoses Final diagnoses:  Shortness of breath    Rx / DC Orders ED Discharge Orders     None        Cherrie Gauze 03/30/21 1734    Teressa Lower, MD 03/30/21 2357

## 2021-03-30 NOTE — Discharge Instructions (Addendum)
You were seen and evaluated in the emergency department today for further evaluation of cough, congestion, and shortness of breath.  As we discussed, imaging for pulmonary embolism was negative.  This is likely due to an upper respiratory infection.  Drink plenty of fluids and get plenty of rest.  Please return to the emergency department if you are experiencing severe and worsening shortness of breath, you pass out, worsening cough, fever that will not go down with Tylenol/ibuprofen, or any other concerns you might have.  Please follow-up with your primary care provider within the next week.

## 2021-03-30 NOTE — ED Triage Notes (Signed)
Went to urgent care for productive cough and congestion x3 days and sent to ER due to elevated to D-Dimer. Covid negative.

## 2022-05-11 ENCOUNTER — Encounter: Payer: Self-pay | Admitting: Radiology

## 2022-06-20 ENCOUNTER — Ambulatory Visit: Payer: BC Managed Care – PPO | Admitting: Obstetrics and Gynecology

## 2022-06-20 ENCOUNTER — Other Ambulatory Visit (HOSPITAL_COMMUNITY)
Admission: RE | Admit: 2022-06-20 | Discharge: 2022-06-20 | Disposition: A | Discharge status: Home / Self Care | Payer: BC Managed Care – PPO | Source: Ambulatory Visit | Attending: Obstetrics and Gynecology | Admitting: Obstetrics and Gynecology

## 2022-06-20 ENCOUNTER — Encounter: Payer: Self-pay | Admitting: Obstetrics and Gynecology

## 2022-06-20 VITALS — BP 118/74 | HR 72 | Ht 68.0 in | Wt 225.0 lb

## 2022-06-20 DIAGNOSIS — D2261 Melanocytic nevi of right upper limb, including shoulder: Secondary | ICD-10-CM | POA: Insufficient documentation

## 2022-06-20 DIAGNOSIS — Z803 Family history of malignant neoplasm of breast: Secondary | ICD-10-CM

## 2022-06-20 DIAGNOSIS — R635 Abnormal weight gain: Secondary | ICD-10-CM | POA: Diagnosis not present

## 2022-06-20 DIAGNOSIS — N920 Excessive and frequent menstruation with regular cycle: Secondary | ICD-10-CM | POA: Diagnosis not present

## 2022-06-20 DIAGNOSIS — Z23 Encounter for immunization: Secondary | ICD-10-CM | POA: Diagnosis not present

## 2022-06-20 DIAGNOSIS — Z113 Encounter for screening for infections with a predominantly sexual mode of transmission: Secondary | ICD-10-CM | POA: Insufficient documentation

## 2022-06-20 DIAGNOSIS — Z01419 Encounter for gynecological examination (general) (routine) without abnormal findings: Secondary | ICD-10-CM | POA: Diagnosis not present

## 2022-06-20 DIAGNOSIS — Z124 Encounter for screening for malignant neoplasm of cervix: Secondary | ICD-10-CM

## 2022-06-20 DIAGNOSIS — Z3044 Encounter for surveillance of vaginal ring hormonal contraceptive device: Secondary | ICD-10-CM

## 2022-06-20 DIAGNOSIS — Z8041 Family history of malignant neoplasm of ovary: Secondary | ICD-10-CM

## 2022-06-20 MED ORDER — ETONOGESTREL-ETHINYL ESTRADIOL 0.12-0.015 MG/24HR VA RING: 1.0000 | VAGINAL_RING | VAGINAL | 3 refills | Status: DC

## 2022-06-20 MED ORDER — NAPROXEN SODIUM 550 MG PO TABS: 550.0000 mg | ORAL_TABLET | Freq: Two times a day (BID) | ORAL | 3 refills | Status: AC

## 2022-06-20 NOTE — Progress Notes (Signed)
31 y.o. G0P0000 Single White or Caucasian Not Hispanic or Latino female here for annual exam.   Period Cycle (Days): 28 Period Duration (Days): 6-7 Period Pattern: Regular Menstrual Flow: Heavy Menstrual Control: Other (Comment) (CUP) Menstrual Control Change Freq (Hours): 4-6 Dysmenorrhea: (!) Mild Dysmenorrhea Symptoms: Cramping, Headache If she was using a super tampon in 3-4 hours prior to switching to the cup.  Not currently sexually active.   She has had issues with steady weight gain.   She had a slightly elevated cholesterol with her primary  She broke her ankle in March, 22 and has 2 herniated disc's. Does low impact exercises.   Patient's last menstrual period was 05/03/2022.          Sexually active: No.  The current method of family planning is NuvaRing vaginal inserts.    Exercising: Yes.     Cardio  Smoker:  no  Health Maintenance: Pap:  11/30/16 WNL  History of abnormal Pap:  no MMG:  03/03/20 density B Bi-rads 1 neg  BMD:   none  Colonoscopy: none  TDaP:  07/12/2011 Gardasil: complete per patient    reports that she has never smoked. She has never used smokeless tobacco. She reports current alcohol use of about 1.0 standard drink of alcohol per week. She reports that she does not use drugs. She is a Animal nutritionist at [employer].   Past Medical History:  Diagnosis Date   Allergy    Anemia    Anxiety    Breast cyst    Depression    Family history of breast cancer    My Risk neg 2/17, NBN VUS   Family history of ovarian cancer    Increased risk of breast cancer    IBIS Lifetime risk 25.4%; MyRisk neg   Neuromuscular disorder [facility])     Past Surgical History:  Procedure Laterality Date   MANDIBLE SURGERY Bilateral 01/012011   nerve damage with numbness on the chin on the right side.   ORIF ANKLE FRACTURE Right 09/23/2020   Procedure: Open Reduction Internal Fixation (ORIF) Right Trimalleolar Fracture;  Surgeon: Wylene Simmer, MD;  Location: [facility];  Service: Orthopedics;  Laterality: Right;    Current Outpatient Medications  Medication Sig Dispense Refill   etonogestrel-ethinyl estradiol (NUVARING) 0.12-0.015 MG/24HR vaginal ring Place 1 each vaginally every 28 (twenty-eight) days. Insert vaginally and leave in place for 3 consecutive weeks, then remove for 1 week.     sertraline (ZOLOFT) 50 MG tablet Take 75 mg by mouth daily.     No current facility-administered medications for this visit.    Family History  Problem Relation Age of Onset   Breast cancer Maternal Aunt 49   Breast cancer Maternal Grandmother 52   Cerebral aneurysm Maternal Grandmother    Breast cancer Maternal Grandfather 50   Ovarian cancer Maternal Grandfather    Stroke Maternal Grandfather    Macular degeneration Maternal Grandfather    Ovarian cancer Paternal Grandmother 62   Breast cancer Cousin        maternal 2nd cousin   Breast cancer Maternal Uncle    Parkinson's disease Paternal Aunt     Review of Systems  All other systems reviewed and are negative.   Exam:   BP 118/74   Pulse 72   Ht '5\' 8"'$  (1.727 m)   Wt 225 lb (102.1 kg)   LMP 05/03/2022 Comment: patient uses nuvaring and skipped her last period.  SpO2 100%   BMI 34.21 kg/m  Weight change: '@WEIGHTCHANGE'$ @ Height:   Height: '5\' 8"'$  (172.7 cm)  Ht Readings from Last 3 Encounters:  06/20/22 '5\' 8"'$  (1.727 m)  03/30/21 '5\' 8"'$  (1.727 m)  09/23/20 '5\' 8"'$  (1.727 m)    General appearance: alert, cooperative and appears stated age Head: Normocephalic, without obvious abnormality, atraumatic Neck: no adenopathy, supple, symmetrical, trachea midline and thyroid normal to inspection and palpation Lungs: clear to auscultation bilaterally Cardiovascular: regular rate and rhythm Breasts: normal appearance, no masses or tenderness Abdomen: soft, non-tender; non distended,  no masses,  no organomegaly Extremities: extremities normal, atraumatic, no cyanosis or edema Skin: Skin color, texture,  turgor normal. No rashes or lesions Lymph nodes: Cervical, supraclavicular, and axillary nodes normal. No abnormal inguinal nodes palpated Neurologic: Grossly normal   Pelvic: External genitalia:  no lesions              Urethra:  normal appearing urethra with no masses, tenderness or lesions              Bartholins and Skenes: normal                 Vagina: normal appearing vagina with normal color and discharge, no lesions              Cervix: no lesions and + ectropion, friable with pap               Bimanual Exam:  Uterus:   anteverted, mobile, not appreciable enlarged, not tender              Adnexa: no mass, fullness, tenderness               Rectovaginal: Confirms               Anus:  normal sphincter tone, no lesions  Gae Dry, CMA chaperoned for the exam.  1. Well woman exam Discussed breast self exam Discussed calcium and vit D intake  2. Menorrhagia with regular cycle We discussed the option of nuvaring and mirena IUD - TSH - CBC - Ferritin - naproxen sodium (ANAPROX DS) 550 MG tablet; Take 1 tablet (550 mg total) by mouth 2 (two) times daily with a meal. Take prn  Dispense: 30 tablet; Refill: 3  3. Screening for cervical cancer - Cytology - PAP  4. Screening examination for STD (sexually transmitted disease) - RPR - HIV Antibody (routine testing w rflx) - Hepatitis C antibody - HSV(herpes simplex vrs) 1+2 ab-IgG - Cytology - PAP  5. Encounter for surveillance of vaginal ring hormonal contraceptive device - etonogestrel-ethinyl estradiol (NUVARING) 0.12-0.015 MG/24HR vaginal ring; Place 1 each vaginally every 28 (twenty-eight) days. Insert vaginally and leave in place for 3.5 consecutive weeks, then remove for 4 days  Dispense: 3 each; Refill: 3  6. Family history of breast cancer - Ambulatory referral to Genetics  7. Family history of ovarian cancer - Ambulatory referral to Genetics  8. Weight gain Will refer to [facility]  9.  Immunization due - Tdap vaccine greater than or equal to 7yo IM

## 2022-06-20 NOTE — Patient Instructions (Signed)

## 2022-06-21 LAB — CBC
HCT: 39.2 % (ref 35.0–45.0)
Hemoglobin: 13 g/dL (ref 11.7–15.5)
MCH: 28.1 pg (ref 27.0–33.0)
MCHC: 33.2 g/dL (ref 32.0–36.0)
MCV: 84.7 fL (ref 80.0–100.0)
MPV: 10 fL (ref 7.5–12.5)
Platelets: 276 10*3/uL (ref 140–400)
RBC: 4.63 10*6/uL (ref 3.80–5.10)
RDW: 13.1 % (ref 11.0–15.0)
WBC: 6.9 10*3/uL (ref 3.8–10.8)

## 2022-06-21 LAB — RPR: RPR Ser Ql: NONREACTIVE

## 2022-06-21 LAB — HEPATITIS C ANTIBODY: Hepatitis C Ab: NONREACTIVE

## 2022-06-21 LAB — FERRITIN: Ferritin: 8 ng/mL — ABNORMAL LOW (ref 16–154)

## 2022-06-21 LAB — HSV(HERPES SIMPLEX VRS) I + II AB-IGG
HAV 1 IGG,TYPE SPECIFIC AB: <0.9 index
HSV 2 IGG,TYPE SPECIFIC AB: <0.9 index

## 2022-06-21 LAB — HIV ANTIBODY (ROUTINE TESTING W REFLEX): HIV 1&2 Ab, 4th Generation: NONREACTIVE

## 2022-06-21 LAB — TSH: TSH: 1.42 mIU/L

## 2022-06-22 LAB — CYTOLOGY - PAP
Chlamydia: NEGATIVE
Comment: NEGATIVE
Comment: NEGATIVE
Comment: NEGATIVE
Comment: NORMAL
Diagnosis: NEGATIVE
High risk HPV: NEGATIVE
Neisseria Gonorrhea: NEGATIVE
Trichomonas: NEGATIVE

## 2022-08-28 ENCOUNTER — Telehealth: Payer: Self-pay

## 2022-08-28 NOTE — Telephone Encounter (Signed)
Patient called to request a copy of her BRACA testing results that was done in 2017. The records are achieved and will need to be pulled from another location of source. Please advise patient on how to obtain this information. Contact patient at [phone].

## 2022-08-29 ENCOUNTER — Encounter: Payer: Self-pay | Admitting: Genetic Counselor

## 2022-08-29 DIAGNOSIS — Z9189 Other specified personal risk factors, not elsewhere classified: Secondary | ICD-10-CM | POA: Insufficient documentation

## 2022-08-29 DIAGNOSIS — Z1379 Encounter for other screening for genetic and chromosomal anomalies: Secondary | ICD-10-CM | POA: Insufficient documentation

## 2022-08-30 ENCOUNTER — Encounter: Payer: Self-pay | Admitting: Genetic Counselor

## 2022-08-31 ENCOUNTER — Inpatient Hospital Stay: Payer: BC Managed Care – PPO

## 2022-08-31 ENCOUNTER — Inpatient Hospital Stay: Payer: BC Managed Care – PPO | Admitting: Genetic Counselor

## 2023-07-05 ENCOUNTER — Other Ambulatory Visit: Payer: Self-pay

## 2023-07-05 DIAGNOSIS — Z3044 Encounter for surveillance of vaginal ring hormonal contraceptive device: Secondary | ICD-10-CM

## 2023-07-05 MED ORDER — ETONOGESTREL-ETHINYL ESTRADIOL 0.12-0.015 MG/24HR VA RING: 1.0000 | VAGINAL_RING | VAGINAL | 0 refills | Status: AC

## 2023-07-05 NOTE — Telephone Encounter (Signed)
Med refill request: generic Nuvaring ([place]) Last AEX: 06/20/22 JJ Next AEX: none scheduled Last MMG (if hormonal med) n/a Refill authorized: generic Nuvaring #3, needs office visit for further refills.  Sent to provider for review.

## 2023-07-11 ENCOUNTER — Other Ambulatory Visit: Payer: Self-pay | Admitting: Radiology

## 2023-07-11 DIAGNOSIS — Z3044 Encounter for surveillance of vaginal ring hormonal contraceptive device: Secondary | ICD-10-CM

## 2023-07-12 NOTE — Telephone Encounter (Signed)
 Med refill request: Nuvaring Last AEX: 06/20/22 Next AEX: none scheduled Last MMG (if hormonal med) n/a Refill last filled 07/05/23.  RF denied.  Needs appointment for further refills.  Sent to provider for review.

## 2024-08-26 ENCOUNTER — Ambulatory Visit: Admitting: Obstetrics and Gynecology
